# Patient Record
Sex: Female | Born: 1975
Health system: Southern US, Community
[De-identification: ages and names within clinical notes are randomized; demographics above are authoritative.]

## PROBLEM LIST (undated history)

## (undated) DIAGNOSIS — M1990 Unspecified osteoarthritis, unspecified site: Secondary | ICD-10-CM

## (undated) DIAGNOSIS — F32A Depression, unspecified: Secondary | ICD-10-CM

## (undated) DIAGNOSIS — N926 Irregular menstruation, unspecified: Secondary | ICD-10-CM

## (undated) DIAGNOSIS — F329 Major depressive disorder, single episode, unspecified: Secondary | ICD-10-CM

## (undated) DIAGNOSIS — N920 Excessive and frequent menstruation with regular cycle: Secondary | ICD-10-CM

## (undated) DIAGNOSIS — F419 Anxiety disorder, unspecified: Secondary | ICD-10-CM

## (undated) HISTORY — DX: Excessive and frequent menstruation with regular cycle: N92.0

## (undated) HISTORY — DX: Irregular menstruation, unspecified: N92.6

## (undated) HISTORY — PX: LAPAROSCOPY ABDOMEN DIAGNOSTIC: PRO50

## (undated) HISTORY — DX: Depression, unspecified: F32.A

## (undated) HISTORY — DX: Anxiety disorder, unspecified: F41.9

## (undated) HISTORY — DX: Unspecified osteoarthritis, unspecified site: M19.90

## (undated) HISTORY — DX: Major depressive disorder, single episode, unspecified: F32.9

## (undated) HISTORY — PX: TUBAL LIGATION: SHX77

---

## 2005-10-19 ENCOUNTER — Inpatient Hospital Stay: Payer: Self-pay | Admitting: Internal Medicine

## 2006-09-05 ENCOUNTER — Inpatient Hospital Stay: Payer: Self-pay | Admitting: Internal Medicine

## 2007-05-30 IMAGING — US ABDOMEN ULTRASOUND
1 series · 17 of 25 positions shown · non-contrast
Comparison: none

REASON FOR EXAM: Persistent nausea,  abdominal pain
COMMENTS:

[Series 1: abdomen ultrasound · 17 of 77 slices shown]
[im 1/77]
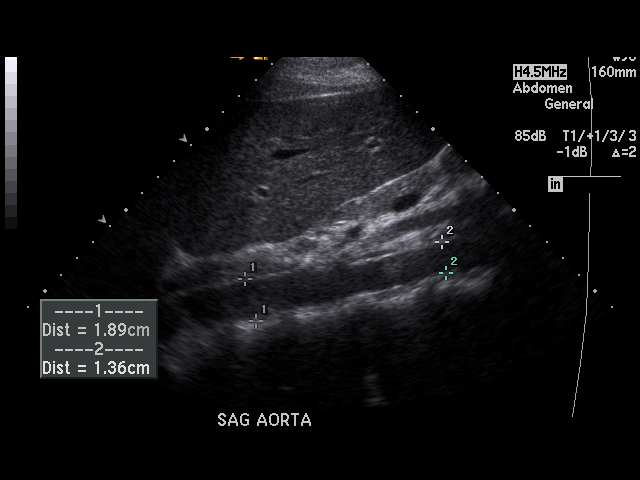
[im 7/77]
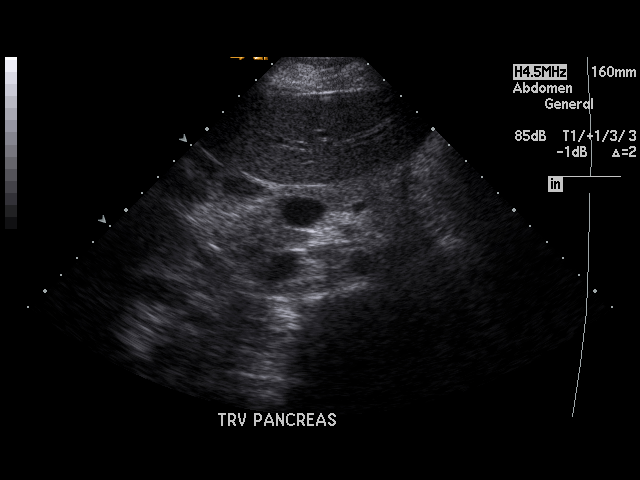
[im 10/77]
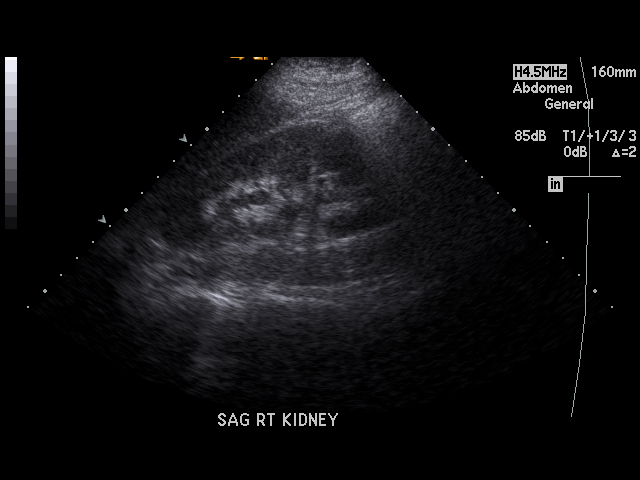
[im 16/77]
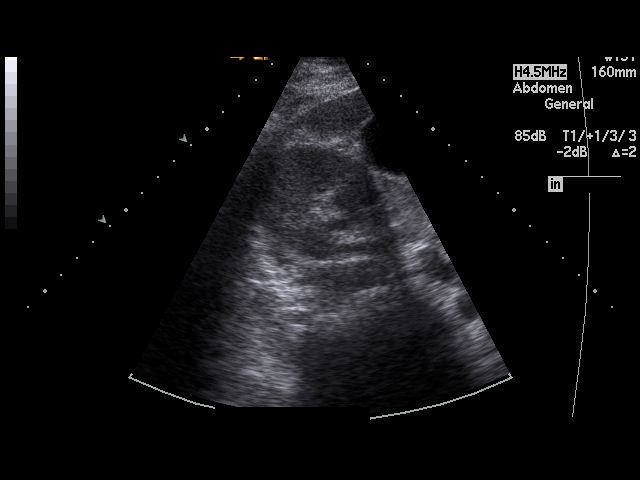
[im 20/77]
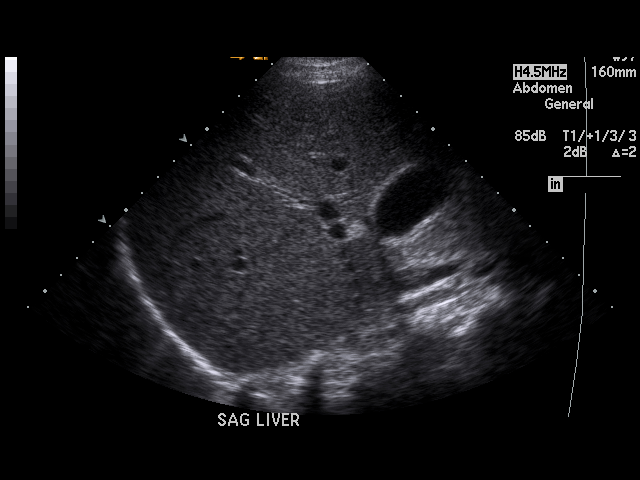
[im 26/77]
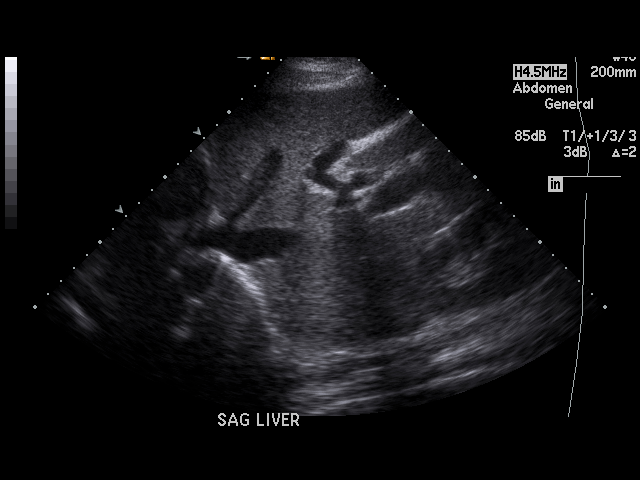
[im 29/77]
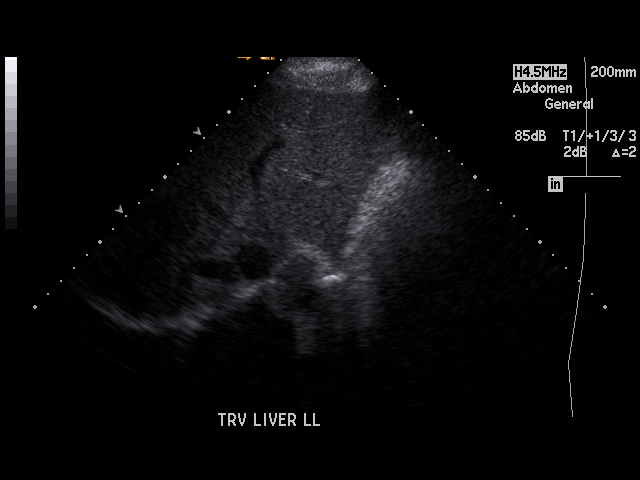
[im 35/77]
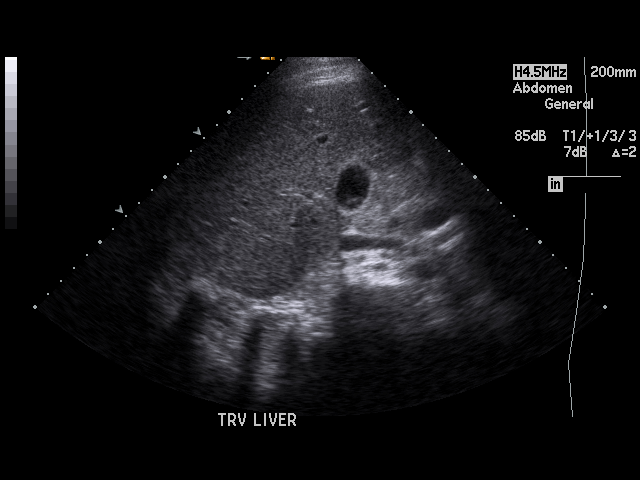
[im 39/77]
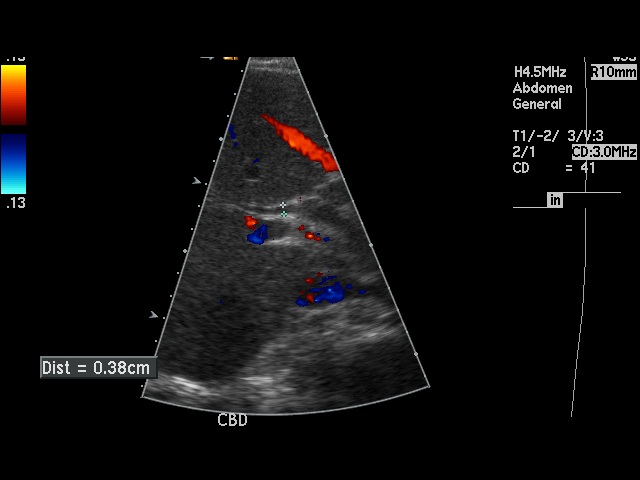
[im 42/77]
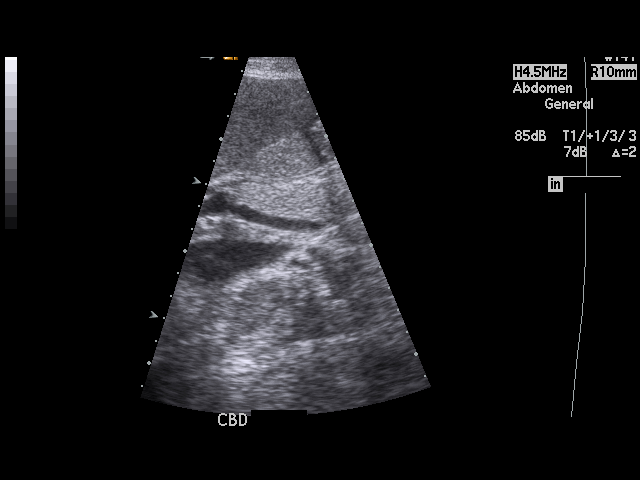
[im 48/77]
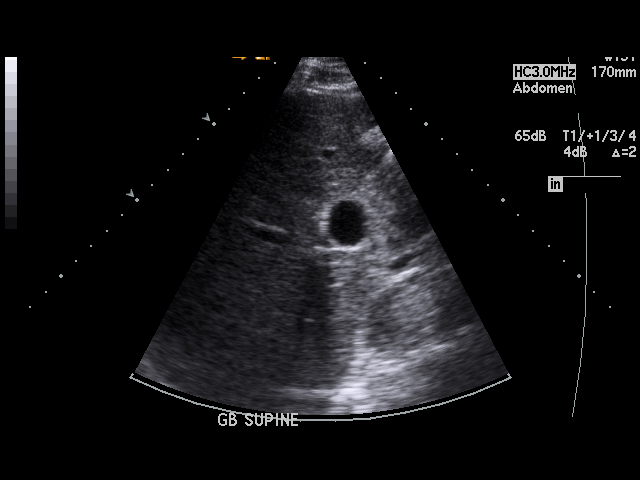
[im 51/77]
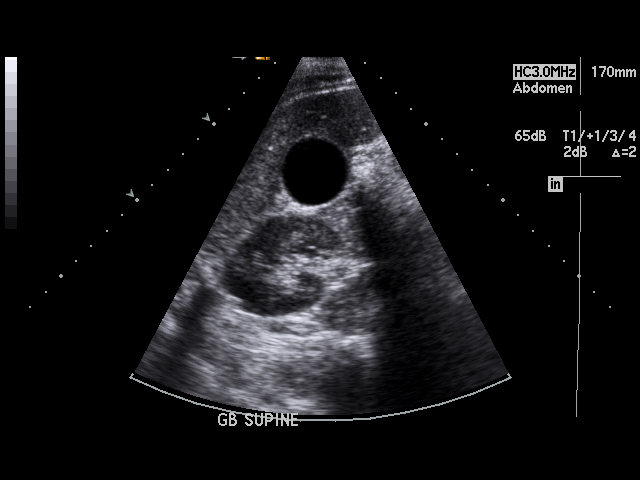
[im 58/77]
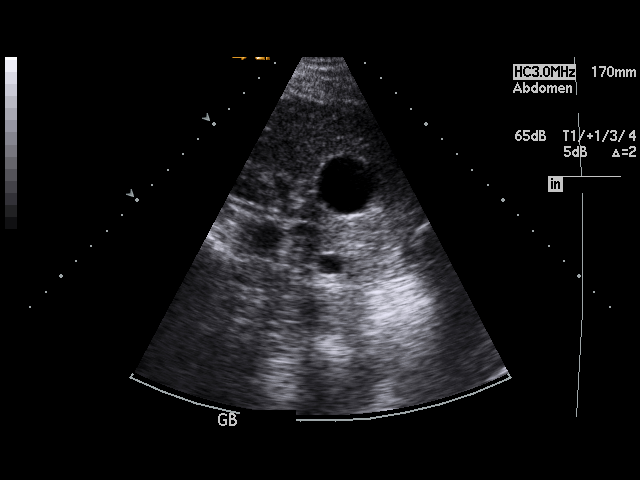
[im 61/77]
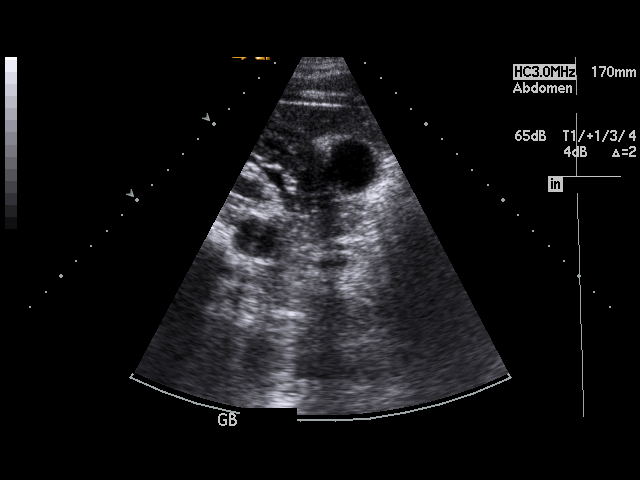
[im 67/77]
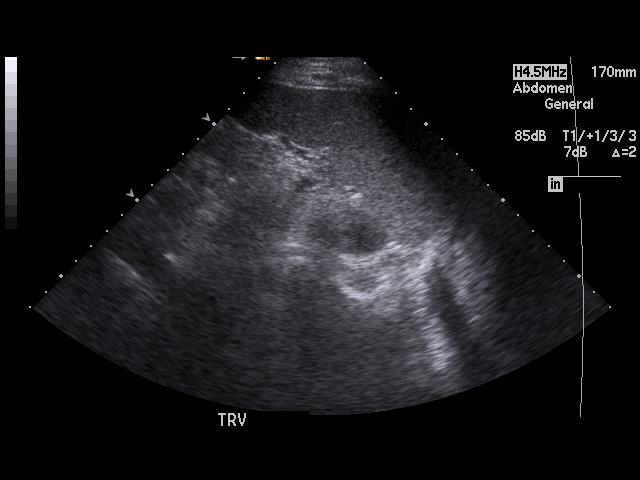
[im 70/77]
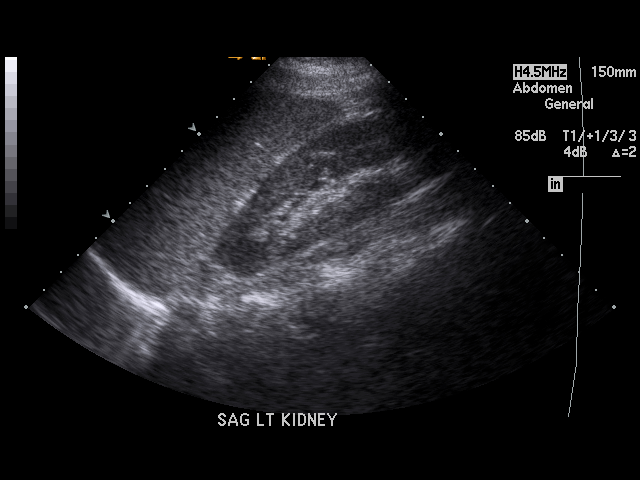
[im 77/77]
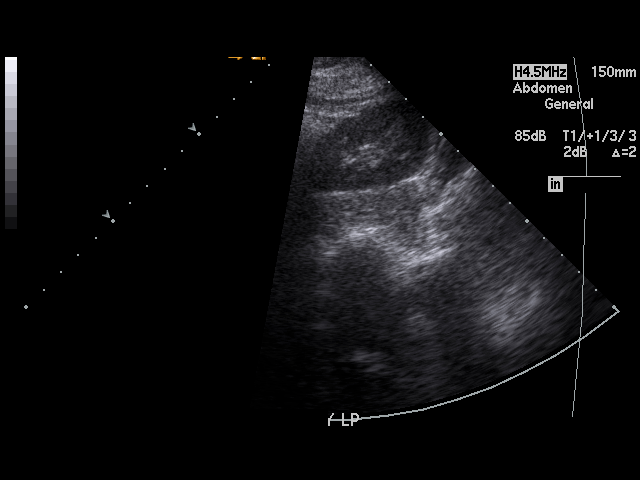

[17 of 25 positions shown; findings below may reference images not displayed]

PROCEDURE:     US  - US ABDOMEN GENERAL SURVEY  - October 25, 2005  [DATE]

RESULT:        The liver, spleen, pancreas and abdominal aorta are normal in
appearance.  No gallstones are seen.  There is no thickening of the
gallbladder wall.  The common bile duct measures 5.5 mm in diameter which is
within normal limits.  The kidneys show no hydronephrosis.  There is no
ascites.
IMPRESSION: No significant abnormalities are noted.

## 2008-01-27 ENCOUNTER — Ambulatory Visit: Payer: Self-pay | Admitting: Unknown Physician Specialty

## 2011-10-13 ENCOUNTER — Emergency Department: Payer: Self-pay | Admitting: Emergency Medicine

## 2011-10-13 LAB — CBC WITH DIFFERENTIAL/PLATELET
Basophil #: 0 10*3/uL (ref 0.0–0.1)
Basophil %: 0.3 %
Eosinophil #: 0.3 10*3/uL (ref 0.0–0.7)
HGB: 11.8 g/dL — ABNORMAL LOW (ref 12.0–16.0)
Lymphocyte #: 0.8 10*3/uL — ABNORMAL LOW (ref 1.0–3.6)
Lymphocyte %: 17 %
MCH: 29.5 pg (ref 26.0–34.0)
Monocyte #: 0.3 10*3/uL (ref 0.0–0.7)
Neutrophil %: 71.7 %
Platelet: 120 10*3/uL — ABNORMAL LOW (ref 150–440)
RDW: 13.3 % (ref 11.5–14.5)
WBC: 4.8 10*3/uL (ref 3.6–11.0)

## 2011-10-13 LAB — BASIC METABOLIC PANEL
Chloride: 107 mmol/L (ref 98–107)
Co2: 24 mmol/L (ref 21–32)
Creatinine: 0.73 mg/dL (ref 0.60–1.30)
Glucose: 87 mg/dL (ref 65–99)
Osmolality: 279 (ref 275–301)
Potassium: 3.7 mmol/L (ref 3.5–5.1)
Sodium: 141 mmol/L (ref 136–145)

## 2011-10-13 LAB — RAPID INFLUENZA A&B ANTIGENS

## 2011-11-06 ENCOUNTER — Ambulatory Visit: Payer: Self-pay | Admitting: Internal Medicine

## 2011-11-28 ENCOUNTER — Ambulatory Visit: Payer: Self-pay | Admitting: Internal Medicine

## 2012-01-02 ENCOUNTER — Encounter: Payer: Self-pay | Admitting: Internal Medicine

## 2012-01-02 ENCOUNTER — Ambulatory Visit (INDEPENDENT_AMBULATORY_CARE_PROVIDER_SITE_OTHER): Payer: BC Managed Care – PPO | Admitting: Internal Medicine

## 2012-01-02 VITALS — BP 142/82 | HR 94 | Temp 98.2°F | Ht 66.5 in | Wt 148.0 lb

## 2012-01-02 DIAGNOSIS — F419 Anxiety disorder, unspecified: Secondary | ICD-10-CM

## 2012-01-02 DIAGNOSIS — R030 Elevated blood-pressure reading, without diagnosis of hypertension: Secondary | ICD-10-CM

## 2012-01-02 DIAGNOSIS — IMO0001 Reserved for inherently not codable concepts without codable children: Secondary | ICD-10-CM

## 2012-01-02 DIAGNOSIS — F411 Generalized anxiety disorder: Secondary | ICD-10-CM

## 2012-01-02 DIAGNOSIS — Z0000 Encounter for general adult medical examination without abnormal findings: Secondary | ICD-10-CM

## 2012-01-02 NOTE — Assessment & Plan Note (Signed)
Blood pressure is elevated today. Patient does not have a history of hypertension. We'll plan to recheck in one month.
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
## 2012-01-02 NOTE — Assessment & Plan Note (Signed)
Offered support today. Patient refuses to consider medication or counseling. Will plan to see her back in one month for followup.

## 2012-01-03 LAB — CBC WITH DIFFERENTIAL/PLATELET
Basophils Relative: 0.8 % (ref 0.0–3.0)
Eosinophils Absolute: 0.1 10*3/uL (ref 0.0–0.7)
Eosinophils Relative: 2.7 % (ref 0.0–5.0)
Hemoglobin: 12.4 g/dL (ref 12.0–15.0)
MCHC: 33.9 g/dL (ref 30.0–36.0)
MCV: 88.1 fl (ref 78.0–100.0)
Monocytes Absolute: 0.6 10*3/uL (ref 0.1–1.0)
Neutro Abs: 3 10*3/uL (ref 1.4–7.7)
Neutrophils Relative %: 55.5 % (ref 43.0–77.0)
RBC: 4.17 Mil/uL (ref 3.87–5.11)
WBC: 5.5 10*3/uL (ref 4.5–10.5)

## 2012-01-03 LAB — COMPREHENSIVE METABOLIC PANEL
AST: 44 U/L — ABNORMAL HIGH (ref 0–37)
Albumin: 4.2 g/dL (ref 3.5–5.2)
Alkaline Phosphatase: 65 U/L (ref 39–117)
BUN: 15 mg/dL (ref 6–23)
Creatinine, Ser: 0.7 mg/dL (ref 0.4–1.2)
Glucose, Bld: 65 mg/dL — ABNORMAL LOW (ref 70–99)
Potassium: 3.8 mEq/L (ref 3.5–5.1)
Total Bilirubin: 0.1 mg/dL — ABNORMAL LOW (ref 0.3–1.2)

## 2012-01-03 LAB — LIPID PANEL
HDL: 34.8 mg/dL — ABNORMAL LOW (ref >39.00)
LDL Cholesterol: 82 mg/dL (ref 0–99)
Total CHOL/HDL Ratio: 4
Triglycerides: 73 mg/dL (ref 0.0–149.0)

## 2012-02-13 ENCOUNTER — Ambulatory Visit: Payer: BC Managed Care – PPO | Admitting: Internal Medicine

## 2012-03-19 ENCOUNTER — Encounter: Payer: Self-pay | Admitting: Internal Medicine

## 2012-03-19 ENCOUNTER — Ambulatory Visit (INDEPENDENT_AMBULATORY_CARE_PROVIDER_SITE_OTHER): Payer: BC Managed Care – PPO | Admitting: Internal Medicine

## 2012-03-19 VITALS — BP 110/76 | HR 80 | Temp 98.3°F | Ht 66.5 in | Wt 145.5 lb

## 2012-03-19 DIAGNOSIS — B373 Candidiasis of vulva and vagina: Secondary | ICD-10-CM

## 2012-03-19 DIAGNOSIS — J309 Allergic rhinitis, unspecified: Secondary | ICD-10-CM | POA: Insufficient documentation

## 2012-03-19 DIAGNOSIS — K759 Inflammatory liver disease, unspecified: Secondary | ICD-10-CM

## 2012-03-19 DIAGNOSIS — J0100 Acute maxillary sinusitis, unspecified: Secondary | ICD-10-CM

## 2012-03-19 LAB — COMPREHENSIVE METABOLIC PANEL
ALT: 56 U/L — ABNORMAL HIGH (ref 0–35)
AST: 44 U/L — ABNORMAL HIGH (ref 0–37)
Chloride: 105 mEq/L (ref 96–112)
Creatinine, Ser: 0.8 mg/dL (ref 0.4–1.2)
Sodium: 139 mEq/L (ref 135–145)
Total Bilirubin: 0.3 mg/dL (ref 0.3–1.2)
Total Protein: 7.8 g/dL (ref 6.0–8.3)

## 2012-03-19 MED ORDER — FLUCONAZOLE 150 MG PO TABS: 150.0000 mg | ORAL_TABLET | Freq: Once | ORAL | Status: AC

## 2012-03-19 MED ORDER — AMOXICILLIN-POT CLAVULANATE 875-125 MG PO TABS: 1.0000 | ORAL_TABLET | Freq: Two times a day (BID) | ORAL | Status: AC

## 2012-03-19 MED ORDER — FLUTICASONE PROPIONATE 50 MCG/ACT NA SUSP: 2.0000 | Freq: Every day | NASAL | Status: DC

## 2012-03-19 NOTE — Assessment & Plan Note (Signed)
Symptoms and exam are consistent with acute maxillary sinusitis. Will treat with Augmentin. Patient will continue to use Zyrtec or Allegra with Sudafed as needed. She will also use ibuprofen as needed. She will call if symptoms are not improving over next 72 hours.

## 2012-03-19 NOTE — Progress Notes (Signed)
Subjective:    Patient ID: Cheryl Durham, female    DOB: 1976/03/07, 36 y.o.   MRN: [id number]  HPI 36YO female presents for acute visit complaining of a ten-day history of sinus congestion, facial pain, purulent drainage from her eyes, fatigue. Symptoms first began 10 days ago with clear nasal congestion. She then developed more purulent nasal congestion, headache, and redness in her eyes with purulent drainage. She denies fever or chills. She denies cough. She has been taking over-the-counter Allegra, Zyrtec, and Mucinex with no improvement.  She is also concerned today about history of elevated liver function tests. These were noted on previous labs. There is mild elevation of ALT and AST. Patient reports that her husband was previously diagnosed with viral hepatitis. She is unsure which type. She notes he was treated with medication at a local hospital. She has never been checked for hepatitis.  Outpatient Encounter Prescriptions as of 03/19/2012  Medication Sig Dispense Refill  . cetirizine (ZYRTEC) 10 MG tablet Take 10 mg by mouth daily.      . diphenhydrAMINE (BENADRYL) 25 MG tablet Take 25 mg by mouth at bedtime as needed.      . fexofenadine (ALLEGRA) 180 MG tablet Take 180 mg by mouth daily.      . Melatonin 3 MG CAPS Take 2 capsules by mouth at bedtime as needed.      Marland Kitchen amoxicillin-clavulanate (AUGMENTIN) 875-125 MG per tablet Take 1 tablet by mouth 2 (two) times daily.  20 tablet  0  . fluconazole (DIFLUCAN) 150 MG tablet Take 1 tablet (150 mg total) by mouth once.  1 tablet  0  . fluticasone (FLONASE) 50 MCG/ACT nasal spray Place 2 sprays into the nose daily.  16 g  6    Review of Systems  Constitutional: Positive for fatigue. Negative for fever, chills and unexpected weight change.  HENT: Positive for congestion, facial swelling, rhinorrhea, postnasal drip and sinus pressure. Negative for hearing loss, ear pain, nosebleeds, sore throat, sneezing, mouth sores, trouble swallowing,  neck pain, neck stiffness, voice change, tinnitus and ear discharge.   Eyes: Positive for discharge and redness. Negative for pain and visual disturbance.  Respiratory: Negative for cough, chest tightness, shortness of breath, wheezing and stridor.   Cardiovascular: Negative for chest pain, palpitations and leg swelling.  Musculoskeletal: Negative for myalgias and arthralgias.  Skin: Negative for color change and rash.  Neurological: Positive for headaches. Negative for dizziness, weakness and light-headedness.  Hematological: Negative for adenopathy.   BP 110/76  Pulse 80  Temp 98.3 F (36.8 C) (Oral)  Ht 5' 6.5" (1.689 m)  Wt 145 lb 8 oz (65.998 kg)  BMI 23.13 kg/m2  SpO2 98%  LMP 03/17/2012     Objective:   Physical Exam  Constitutional: She is oriented to person, place, and time. She appears well-developed and well-nourished. No distress.  HENT:  Head: Normocephalic and atraumatic.  Right Ear: Tympanic membrane and external ear normal.  Left Ear: Tympanic membrane and external ear normal.  Nose: Mucosal edema and rhinorrhea present.  Mouth/Throat: Oropharynx is clear and moist. No oropharyngeal exudate.  Eyes: EOM are normal. Pupils are equal, round, and reactive to light. Right eye exhibits discharge. Left eye exhibits discharge. Right conjunctiva is injected. Left conjunctiva is injected. No scleral icterus.  Neck: Normal range of motion. Neck supple. No tracheal deviation present. No thyromegaly present.  Cardiovascular: Normal rate, regular rhythm, normal heart sounds and intact distal pulses.  Exam reveals no gallop and no friction rub.  No murmur heard. Pulmonary/Chest: Effort normal and breath sounds normal. No respiratory distress. She has no wheezes. She has no rales. She exhibits no tenderness.  Musculoskeletal: Normal range of motion. She exhibits no edema and no tenderness.  Lymphadenopathy:    She has no cervical adenopathy.  Neurological: She is alert and  oriented to person, place, and time. No cranial nerve deficit. She exhibits normal muscle tone. Coordination normal.  Skin: Skin is warm and dry. No rash noted. She is not diaphoretic. No erythema. No pallor.  Psychiatric: She has a normal mood and affect. Her behavior is normal. Judgment and thought content normal.          Assessment & Plan:

## 2012-03-19 NOTE — Assessment & Plan Note (Signed)
Patient was noted to have mild elevation of LFTs. However, she reports that her husband was diagnosed with viral hepatitis and description is most consistent with hepatitis C. Will send a screening for hepatitis B and hepatitis C today.

## 2012-03-20 LAB — HEPATITIS B SURFACE ANTIBODY,QUALITATIVE: Hep B S Ab: NEGATIVE

## 2012-03-20 LAB — HEPATITIS B SURFACE ANTIGEN: Hepatitis B Surface Ag: NEGATIVE

## 2012-08-06 ENCOUNTER — Ambulatory Visit (INDEPENDENT_AMBULATORY_CARE_PROVIDER_SITE_OTHER): Payer: BC Managed Care – PPO | Admitting: Internal Medicine

## 2012-08-06 ENCOUNTER — Encounter: Payer: Self-pay | Admitting: Internal Medicine

## 2012-08-06 VITALS — BP 126/78 | HR 66 | Temp 98.4°F | Resp 12 | Ht 66.5 in | Wt 154.2 lb

## 2012-08-06 DIAGNOSIS — J069 Acute upper respiratory infection, unspecified: Secondary | ICD-10-CM

## 2012-08-06 MED ORDER — PROMETHAZINE HCL 12.5 MG PO TABS: 12.5000 mg | ORAL_TABLET | Freq: Three times a day (TID) | ORAL | Status: DC | PRN

## 2012-08-06 MED ORDER — AMOXICILLIN-POT CLAVULANATE 875-125 MG PO TABS: 1.0000 | ORAL_TABLET | Freq: Two times a day (BID) | ORAL | Status: DC

## 2012-08-06 NOTE — Patient Instructions (Signed)
You have a viral  Syndrome .  The post nasal drip is causing your sore throat.  Flush your sinuses twice daily with Simply saline nasal spray.  Use benadryl 25 mg every 8 hours and Sudafed PE 10 to 30 every 8 hours to manage post nasal drip and  congestion.  Gargle with salt water as needed for the sore throat.  I am calling in phenergan for the nausea .  If you develop a temperature  > 100.4,  Greenor bloody nasal discharge,  Or facial pain,  You can start the antibiotic.

## 2012-08-06 NOTE — Progress Notes (Signed)
Patient ID: Cheryl Durham, female   DOB: Apr 16, 1976, 36 y.o.   MRN: [id number]  Patient Active Problem List  Diagnosis  . Anxiety  . Elevated BP  . Sinusitis, acute maxillary  . Allergic rhinitis  . Vaginal candidiasis  . Hepatitis  . Acute URI of multiple sites    Subjective:  CC:   Chief Complaint  Patient presents with  . Sinus Problem    HPI:   Cheryl Durham a 36 y.o. female who presents with Sore throat and ear ache started on Sunday night , left > right.,  Then sinuses congested  And she developed mild  nausea.  No fevers, myalgias or sick contacts.,  No purueltn nasal drainage or sputum. Taking OTC meds intermittently    Past Medical History  Diagnosis Date  . Endometriosis   . Heavy menstrual bleeding   . Abnormal menstrual cycle   . 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 Anxiety   . Depression     Past Surgical History  Procedure Date  . Tubal ligation   . Laparoscopy abdomen diagnostic          The following portions of the patient's history were reviewed and updated as appropriate: Allergies, current medications, and problem list.    Review of Systems:   12 Pt  review of systems was negative except those addressed in the HPI,     History   Social History  . Marital Status: Married    Spouse Name: N/A    Number of Children: 0  . Years of Education: N/A   Occupational History  . [employer]    Social History Main Topics  . Smoking status: Never Smoker   . Smokeless tobacco: Never Used  . Alcohol Use: Yes     Rarely  . Drug Use: No  . Sexually Active: Not on file   Other Topics Concern  . Not on file   Social History Narrative   Lives in [place]. Married, has 2 step children. Work - Education officer, environmental, Education officer, environmental. Dog in house.Regular Exercise -  Run 4 to 5 times a week typically 3-4 milesDaily Caffeine Use:  Mt Dew 2 liter daily    Objective:  BP 126/78  Pulse 66  Temp 98.4 F (36.9 C) (Oral)  Resp 12   Ht 5' 6.5" (1.689 m)  Wt 154 lb 4 oz (69.967 kg)  BMI 24.52 kg/m2  SpO2 97%  LMP 08/06/2012  General appearance: alert, cooperative and appears stated age Ears: normal TM's and external ear canals both ears Throat: lips, mucosa, and tongue normal; teeth and gums normal Neck: no adenopathy, no carotid bruit, supple, symmetrical, trachea midline and thyroid not enlarged, symmetric, no tenderness/mass/nodules Back: symmetric, no curvature. ROM normal. No CVA tenderness. Lungs: clear to auscultation bilaterally Heart: regular rate and rhythm, S1, S2 normal, no murmur, click, rub or gallop Abdomen: soft, non-tender; bowel sounds normal; no masses,  no organomegaly Pulses: 2+ and symmetric Skin: Skin color, texture, turgor normal. No rashes or lesions Lymph nodes: Cervical, supraclavicular, and axillary nodes normal.  Assessment and Plan:  Acute URI of multiple sites Rapid influenza was negative and no signs of strep pharyngitis or OM on exam. Symptoms of  URI are caused by viral infection currently given her current symptoms.   I have explained that in viral URIS, an antibiotic will not help the symptoms and will increase the risk of developing diarrhea. Advised to use oral and nasal decongestants,  Ibuprofen 400 mg and tylenol 650 mq 8 hrs for aches and pains,  tessalon every 8 hours prn cough  Advised to start round of abx only if symptoms worsen to include fevers, facial pain, purulent sputum./drainage.    Updated Medication List Outpatient Encounter Prescriptions as of 08/06/2012  Medication Sig Dispense Refill  . cetirizine (ZYRTEC) 10 MG tablet Take 10 mg by mouth daily.      . diphenhydrAMINE (BENADRYL) 25 MG tablet Take 25 mg by mouth at bedtime as needed.      . fluticasone (FLONASE) 50 MCG/ACT nasal spray Place 2 sprays into the nose daily.  16 g  6  . Melatonin 3 MG CAPS Take 2 capsules by mouth at bedtime as needed.      Marland Kitchen amoxicillin-clavulanate (AUGMENTIN) 875-125 MG per  tablet Take 1 tablet by mouth 2 (two) times daily.  14 tablet  0  . fexofenadine (ALLEGRA) 180 MG tablet Take 180 mg by mouth daily.      . promethazine (PHENERGAN) 12.5 MG tablet Take 1 tablet (12.5 mg total) by mouth every 8 (eight) hours as needed for nausea.  30 tablet  0     No orders of the defined types were placed in this encounter.    No Follow-up on file.

## 2012-08-08 ENCOUNTER — Encounter: Payer: Self-pay | Admitting: Internal Medicine

## 2012-08-08 DIAGNOSIS — J069 Acute upper respiratory infection, unspecified: Secondary | ICD-10-CM | POA: Insufficient documentation

## 2012-08-08 NOTE — Assessment & Plan Note (Signed)
Rapid influenza was negative and no signs of strep pharyngitis or OM on exam. Symptoms of  URI are caused by viral infection currently given her current symptoms.   I have explained that in viral URIS, an antibiotic will not help the symptoms and will increase the risk of developing diarrhea. Advised to use oral and nasal decongestants,  Ibuprofen 400 mg and tylenol 650 mq 8 hrs for aches and pains,  tessalon every 8 hours prn cough  Advised to start round of abx only if symptoms worsen to include fevers, facial pain, purulent sputum./drainage.

## 2012-08-11 ENCOUNTER — Other Ambulatory Visit: Payer: Self-pay | Admitting: Internal Medicine

## 2012-08-11 MED ORDER — FLUCONAZOLE 150 MG PO TABS: 150.0000 mg | ORAL_TABLET | Freq: Once | ORAL | Status: DC

## 2012-08-11 NOTE — Telephone Encounter (Signed)
Pt would like something called in for yeast infection [facility]

## 2012-08-11 NOTE — Telephone Encounter (Signed)
Please call in Diflucan 150mg  po x1

## 2012-08-11 NOTE — Telephone Encounter (Signed)
Rx sent to [facility], patient advised via message left on cell phone voicemail.

## 2013-08-24 ENCOUNTER — Telehealth: Payer: Self-pay | Admitting: Internal Medicine

## 2013-08-24 NOTE — Telephone Encounter (Signed)
Was in several months ago and she had several labs done, would like to know if her thyroid was tested. No thyroid testing was done, she has not been seen since last year 12/2011 by you, did have an acute visit with Dr. Darrick Huntsman 03/2012. She would like to come in to have some labs drawn then be seen for an appointment.

## 2013-08-24 NOTE — Telephone Encounter (Signed)
Pt asking what all tests were run on her at her previous visit.  Asking if she had anything tested for thyroid.  If not, pt would like to come in for blood work.  Please contact pt.

## 2013-08-24 NOTE — Telephone Encounter (Signed)
Her thyroid function was not checked. I would recommend that we set up a visit first, that way we can determine if any additional labs need to be ordered.

## 2013-08-25 NOTE — Telephone Encounter (Signed)
Left message for patient to call office back to schedule an appointment 

## 2013-11-10 ENCOUNTER — Telehealth: Payer: Self-pay | Admitting: Internal Medicine

## 2013-11-10 ENCOUNTER — Emergency Department: Payer: Self-pay | Admitting: Emergency Medicine

## 2013-11-10 LAB — BASIC METABOLIC PANEL
Anion Gap: 4 — ABNORMAL LOW (ref 7–16)
BUN: 6 mg/dL — ABNORMAL LOW (ref 7–18)
CALCIUM: 8.5 mg/dL (ref 8.5–10.1)
CO2: 29 mmol/L (ref 21–32)
Chloride: 104 mmol/L (ref 98–107)
Creatinine: 0.66 mg/dL (ref 0.60–1.30)
EGFR (African American): >60
EGFR (Non-African Amer.): >60
GLUCOSE: 91 mg/dL (ref 65–99)
Osmolality: 271 (ref 275–301)
POTASSIUM: 3.8 mmol/L (ref 3.5–5.1)
SODIUM: 137 mmol/L (ref 136–145)

## 2013-11-10 LAB — CBC
HCT: 39.9 % (ref 35.0–47.0)
HGB: 13.4 g/dL (ref 12.0–16.0)
MCH: 29.8 pg (ref 26.0–34.0)
MCHC: 33.7 g/dL (ref 32.0–36.0)
MCV: 89 fL (ref 80–100)
Platelet: 168 10*3/uL (ref 150–440)
RBC: 4.51 10*6/uL (ref 3.80–5.20)
RDW: 13.5 % (ref 11.5–14.5)
WBC: 4.5 10*3/uL (ref 3.6–11.0)

## 2013-11-10 LAB — URINALYSIS, COMPLETE
Bilirubin,UR: NEGATIVE
Blood: NEGATIVE
Glucose,UR: NEGATIVE mg/dL (ref 0–75)
Ketone: NEGATIVE
Leukocyte Esterase: NEGATIVE
NITRITE: NEGATIVE
PROTEIN: NEGATIVE
Ph: 6 (ref 4.5–8.0)
RBC,UR: <1 /HPF (ref 0–5)
SPECIFIC GRAVITY: 1.005 (ref 1.003–1.030)
Squamous Epithelial: 1
WBC UR: 2 /HPF (ref 0–5)

## 2013-11-10 NOTE — Telephone Encounter (Signed)
Patient Information:  Caller Name: Cheryl Durham  Phone: [phone]  Patient: Cheryl Durham, Cheryl Durham  Gender: Female  DOB: 01/02/1976  Age: 38 Years  PCP: Duncan Dullullo, Teresa (Adults only)  Pregnant: No  Office Follow Up:  Does the office need to follow up with this patient?: Yes  Instructions For The Office: Please review  RN Note:  Per standing orders.  Message sent in Epic to try to work in pt. Call back information given  Symptoms  Reason For Call & Symptoms: Pt called for appt and was not able to schedule today.  Onset 2/1.  Pt states that it started after crying.  Eyes are remained puffy.  Pain above eyes.  Vision is blurry.  Pt states that she can not read the computer or book.  Eyes hurt.  No other sxs Afebrile.  No headache  Left eye is worse than the right.  Photophobia is present.  Reviewed Health History In EMR: Yes  Reviewed Medications In EMR: Yes  Reviewed Allergies In EMR: Yes  Reviewed Surgeries / Procedures: Yes  Date of Onset of Symptoms: 11/08/2013 OB / GYN:  LMP: Unknown  Guideline(s) Used:  Eye Pain  Disposition Per Guideline:   Go to Office Now  Reason For Disposition Reached:   Looking at light causes severe pain (i.e., photophobia)  Advice Given:  Call Back If:  You become worse.  RN Overrode Recommendation:  Document Patient  No appt, please review and advise

## 2013-11-10 NOTE — Telephone Encounter (Signed)
Left message to call back  

## 2013-11-11 NOTE — Telephone Encounter (Signed)
Reviewed patient chart just now, appears she called and schedule an appointment to see Dr. Dan HumphreysWalker on Friday.

## 2013-11-13 ENCOUNTER — Ambulatory Visit (INDEPENDENT_AMBULATORY_CARE_PROVIDER_SITE_OTHER): Payer: BC Managed Care – PPO | Admitting: Internal Medicine

## 2013-11-13 ENCOUNTER — Encounter: Payer: Self-pay | Admitting: Internal Medicine

## 2013-11-13 ENCOUNTER — Encounter (INDEPENDENT_AMBULATORY_CARE_PROVIDER_SITE_OTHER): Payer: Self-pay

## 2013-11-13 VITALS — BP 122/86 | HR 72 | Temp 98.0°F | Wt 156.0 lb

## 2013-11-13 DIAGNOSIS — R7989 Other specified abnormal findings of blood chemistry: Secondary | ICD-10-CM

## 2013-11-13 DIAGNOSIS — F419 Anxiety disorder, unspecified: Secondary | ICD-10-CM

## 2013-11-13 DIAGNOSIS — R945 Abnormal results of liver function studies: Secondary | ICD-10-CM

## 2013-11-13 DIAGNOSIS — G4700 Insomnia, unspecified: Secondary | ICD-10-CM

## 2013-11-13 DIAGNOSIS — I10 Essential (primary) hypertension: Secondary | ICD-10-CM

## 2013-11-13 DIAGNOSIS — F411 Generalized anxiety disorder: Secondary | ICD-10-CM

## 2013-11-13 MED ORDER — TRAZODONE HCL 50 MG PO TABS: 25.0000 mg | ORAL_TABLET | Freq: Every evening | ORAL | Status: DC | PRN

## 2013-11-13 NOTE — Assessment & Plan Note (Signed)
Chronic anxiety, recently worsened. Likely exacerbated by poor sleep. As described, will start Trazodone. If no improvement in symptoms, consider starting SSRI and referral to psychiatry. Follow up 4 weeks and prn.
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
## 2013-11-13 NOTE — Progress Notes (Signed)
Pre-visit discussion using our clinic review tool. No additional management support is needed unless otherwise documented below in the visit note.  

## 2013-11-13 NOTE — Assessment & Plan Note (Signed)
Chronic insomnia. Encouraged her to stop Benadryl 75mg , given excessive dose. Encouraged her to stop wine at night. Will start Trazodone 25-50mg  po qhs. She will email or call with follow up. Recheck in 4 weeks.

## 2013-11-13 NOTE — Assessment & Plan Note (Signed)
BP much improved today compared with recent ED visit. Will continue Lisinopril. Discussed potential side effects of this medication. Will recheck Cr and K with labs today. Follow up 4 weeks.

## 2013-11-13 NOTE — Patient Instructions (Signed)
Continue Lisinopril to help control blood pressure. Monitor blood pressure at home 1-2 times per week. Email with readings.  We will check kidney function and electrolytes with labs today.  Start Trazodone to help with insomnia. Email with update next week.

## 2013-11-16 ENCOUNTER — Telehealth: Payer: Self-pay | Admitting: Internal Medicine

## 2013-11-16 LAB — COMPREHENSIVE METABOLIC PANEL
ALT: 131 U/L — AB (ref 0–35)
AST: 91 U/L — AB (ref 0–37)
Albumin: 3.9 g/dL (ref 3.5–5.2)
Alkaline Phosphatase: 65 U/L (ref 39–117)
BUN: 11 mg/dL (ref 6–23)
CALCIUM: 8.8 mg/dL (ref 8.4–10.5)
CHLORIDE: 107 meq/L (ref 96–112)
CO2: 23 meq/L (ref 19–32)
Creatinine, Ser: 0.8 mg/dL (ref 0.4–1.2)
GFR: 83.66 mL/min (ref >60.00)
Glucose, Bld: 102 mg/dL — ABNORMAL HIGH (ref 70–99)
Potassium: 4.5 mEq/L (ref 3.5–5.1)
SODIUM: 138 meq/L (ref 135–145)
TOTAL PROTEIN: 7.9 g/dL (ref 6.0–8.3)
Total Bilirubin: 0.2 mg/dL — ABNORMAL LOW (ref 0.3–1.2)

## 2013-11-16 LAB — MICROALBUMIN / CREATININE URINE RATIO
CREATININE, U: 124.9 mg/dL
MICROALB/CREAT RATIO: 0.4 mg/g (ref 0.0–30.0)
Microalb, Ur: 0.5 mg/dL (ref 0.0–1.9)

## 2013-11-16 LAB — LDL CHOLESTEROL, DIRECT: LDL DIRECT: 153.5 mg/dL

## 2013-11-16 LAB — LIPID PANEL
Cholesterol: 204 mg/dL — ABNORMAL HIGH (ref 0–200)
HDL: 48 mg/dL (ref >39.00)
Total CHOL/HDL Ratio: 4
Triglycerides: 72 mg/dL (ref 0.0–149.0)
VLDL: 14.4 mg/dL (ref 0.0–40.0)

## 2013-11-16 LAB — TSH: TSH: 10.39 u[IU]/mL — ABNORMAL HIGH (ref 0.35–5.50)

## 2013-11-16 NOTE — Telephone Encounter (Signed)
Relevant patient education assigned to patient using Emmi. ° °

## 2013-11-16 NOTE — Addendum Note (Signed)
Addended by: Ronna PolioWALKER, JENNIFER A on: 11/16/2013 07:46 PM   Modules accepted: Orders

## 2013-11-19 ENCOUNTER — Encounter: Payer: Self-pay | Admitting: *Deleted

## 2013-12-10 ENCOUNTER — Telehealth: Payer: Self-pay | Admitting: Internal Medicine

## 2013-12-10 MED ORDER — LEVOTHYROXINE SODIUM 50 MCG PO TABS: 50.0000 ug | ORAL_TABLET | Freq: Every day | ORAL | Status: DC

## 2013-12-10 NOTE — Telephone Encounter (Signed)
Rx sent to pharmacy by escript. Left message, notifying pt Rx sent to pharmacy.

## 2013-12-10 NOTE — Telephone Encounter (Signed)
Pt called regarding letter received about her thyroid.  States she will take the levothyroxine if Dr. Dan HumphreysWalker prescribes it.  States she took the medication a long time ago but stopped taking it because she did not feel any different whether she took it or not.  States she will start taking it again, [facility] [place].  Please call pt to let her know if this is sent, may leave msg.  Advised pt we need to set up appt for blood work, per note in lab results.  Pt has appt 3/16 with Dr. Dan HumphreysWalker for BP and would like to get the blood work done at the same time as her appt to see Dr. Dan HumphreysWalker.

## 2013-12-21 ENCOUNTER — Ambulatory Visit: Payer: BC Managed Care – PPO | Admitting: Internal Medicine

## 2013-12-23 ENCOUNTER — Ambulatory Visit (INDEPENDENT_AMBULATORY_CARE_PROVIDER_SITE_OTHER): Payer: BC Managed Care – PPO | Admitting: Internal Medicine

## 2013-12-23 ENCOUNTER — Encounter: Payer: Self-pay | Admitting: Internal Medicine

## 2013-12-23 VITALS — BP 106/72 | HR 80 | Temp 97.8°F | Wt 152.0 lb

## 2013-12-23 DIAGNOSIS — J309 Allergic rhinitis, unspecified: Secondary | ICD-10-CM

## 2013-12-23 DIAGNOSIS — E039 Hypothyroidism, unspecified: Secondary | ICD-10-CM | POA: Insufficient documentation

## 2013-12-23 DIAGNOSIS — N951 Menopausal and female climacteric states: Secondary | ICD-10-CM

## 2013-12-23 DIAGNOSIS — G4700 Insomnia, unspecified: Secondary | ICD-10-CM

## 2013-12-23 DIAGNOSIS — I10 Essential (primary) hypertension: Secondary | ICD-10-CM

## 2013-12-23 DIAGNOSIS — R232 Flushing: Secondary | ICD-10-CM

## 2013-12-23 LAB — LUTEINIZING HORMONE: LH: 17.18 m[IU]/mL

## 2013-12-23 LAB — FOLLICLE STIMULATING HORMONE: FSH: 7.6 m[IU]/mL

## 2013-12-23 MED ORDER — FLUTICASONE PROPIONATE 50 MCG/ACT NA SUSP: 2.0000 | Freq: Every day | NASAL | Status: AC

## 2013-12-23 MED ORDER — TRAZODONE HCL 150 MG PO TABS: 150.0000 mg | ORAL_TABLET | Freq: Every evening | ORAL | Status: DC | PRN

## 2013-12-23 NOTE — Progress Notes (Signed)
Pre visit review using our clinic review tool, if applicable. No additional management support is needed unless otherwise documented below in the visit note. 

## 2013-12-23 NOTE — Assessment & Plan Note (Signed)
Reviewed correct way to take thyroid medication. Plan repeat TSH in 4-6 weeks.

## 2013-12-23 NOTE — Assessment & Plan Note (Signed)
Recent hot flashes concerned pt for menopause, however LH and FSH not in range of menopause today. Likely temp intolerance related to hypothyroidism. Plan to repeat TSH in 6 weeks, as she recently started thyroid medication.

## 2013-12-23 NOTE — Progress Notes (Signed)
Subjective:    Patient ID: Cheryl Durham, female    DOB: 07/06/1976, 38 y.o.   MRN: [id number]  HPI 38YO female with HTN presents for follow up.  She did not consistently take Lisinopril after last visit. BP has been <120/80 at home. She has been following healthy diet and exercising. No chest pain, headache, palpitations.  She is concerned about recent hot flashes. Most prominent at night. She is concerned this may be menopause.  She also notes no improvement in insomnia with Trazodone. Would like to consider alternative medication. Taking Benadryl and Melatonin with Trazodone.   Review of Systems  Constitutional: Positive for diaphoresis. Negative for fever, chills, appetite change, fatigue and unexpected weight change.  HENT: Positive for postnasal drip, rhinorrhea and sneezing. Negative for congestion, ear pain, sinus pressure, sore throat, trouble swallowing and voice change.   Eyes: Negative for visual disturbance.  Respiratory: Negative for cough, shortness of breath, wheezing and stridor.   Cardiovascular: Negative for chest pain, palpitations and leg swelling.  Gastrointestinal: Negative for nausea, vomiting, abdominal pain, diarrhea, constipation, blood in stool, abdominal distention and anal bleeding.  Endocrine: Positive for heat intolerance.  Genitourinary: Negative for dysuria and flank pain.  Musculoskeletal: Negative for arthralgias, gait problem, myalgias and neck pain.  Skin: Negative for color change and rash.  Neurological: Negative for dizziness and headaches.  Hematological: Negative for adenopathy. Does not bruise/bleed easily.  Psychiatric/Behavioral: Positive for sleep disturbance. Negative for suicidal ideas and dysphoric mood. The patient is not nervous/anxious.        Objective:    BP 106/72  Pulse 80  Temp(Src) 97.8 F (36.6 C) (Oral)  Wt 152 lb (68.947 kg)  SpO2 99%  LMP 11/25/2013 Physical Exam  Constitutional: She is oriented to person,  place, and time. She appears well-developed and well-nourished. No distress.  HENT:  Head: Normocephalic and atraumatic.  Right Ear: External ear normal.  Left Ear: External ear normal.  Nose: Nose normal.  Mouth/Throat: Oropharynx is clear and moist. No oropharyngeal exudate.  Eyes: Conjunctivae are normal. Pupils are equal, round, and reactive to light. Right eye exhibits no discharge. Left eye exhibits no discharge. No scleral icterus.  Neck: Normal range of motion. Neck supple. No tracheal deviation present. No thyromegaly present.  Cardiovascular: Normal rate, regular rhythm, normal heart sounds and intact distal pulses.  Exam reveals no gallop and no friction rub.   No murmur heard. Pulmonary/Chest: Effort normal and breath sounds normal. No accessory muscle usage. Not tachypneic. No respiratory distress. She has no decreased breath sounds. She has no wheezes. She has no rhonchi. She has no rales. She exhibits no tenderness.  Musculoskeletal: Normal range of motion. She exhibits no edema and no tenderness.  Lymphadenopathy:    She has no cervical adenopathy.  Neurological: She is alert and oriented to person, place, and time. No cranial nerve deficit. She exhibits normal muscle tone. Coordination normal.  Skin: Skin is warm and dry. No rash noted. She is not diaphoretic. No erythema. No pallor.  Psychiatric: She has a normal mood and affect. Her behavior is normal. Judgment and thought content normal.          Assessment & Plan:   Problem List Items Addressed This Visit   Allergic rhinitis   Relevant Medications      fluticasone (FLONASE) 50 MCG/ACT nasal spray   Essential hypertension, benign - Primary     BP improved without pt taking medication. Will hold lisinopril for now. Continue to monitor BP.  Encouraged healthy diet and regular exercise, as she is doing.    Hot flashes     Recent hot flashes concerned pt for menopause, however LH and FSH not in range of menopause  today. Likely temp intolerance related to hypothyroidism. Plan to repeat TSH in 6 weeks, as she recently started thyroid medication.    Relevant Orders      FSH (Completed)      LH (Completed)   Insomnia     Symptoms poorly controlled on Trazodone 100mg . Will try increase in dose to 150mg  daily. Follow up 4 weeks and prn.    Relevant Medications      traZODone (DESYREL) tablet   Unspecified hypothyroidism     Reviewed correct way to take thyroid medication. Plan repeat TSH in 4-6 weeks.        Return in about 4 weeks (around 01/20/2014) for Recheck Thyroid.

## 2013-12-23 NOTE — Assessment & Plan Note (Signed)
BP improved without pt taking medication. Will hold lisinopril for now. Continue to monitor BP. Encouraged healthy diet and regular exercise, as she is doing.

## 2013-12-23 NOTE — Assessment & Plan Note (Signed)
Symptoms poorly controlled on Trazodone 100mg . Will try increase in dose to 150mg  daily. Follow up 4 weeks and prn.

## 2013-12-24 ENCOUNTER — Ambulatory Visit: Payer: BC Managed Care – PPO | Admitting: Internal Medicine

## 2014-02-03 ENCOUNTER — Ambulatory Visit: Payer: BC Managed Care – PPO | Admitting: Internal Medicine

## 2014-02-15 ENCOUNTER — Ambulatory Visit: Payer: BC Managed Care – PPO | Admitting: Internal Medicine

## 2014-05-09 ENCOUNTER — Other Ambulatory Visit: Payer: Self-pay | Admitting: Internal Medicine

## 2014-05-12 NOTE — Telephone Encounter (Signed)
Called and left message, notifying need for non fasting lab appointment

## 2014-05-13 NOTE — Telephone Encounter (Signed)
Pharmacy sent third request, no response from patient.

## 2014-05-20 ENCOUNTER — Telehealth: Payer: Self-pay | Admitting: *Deleted

## 2014-05-20 NOTE — Telephone Encounter (Signed)
She had thyroid dysfunction and marked elevation of liver function tests on last check. She must have LFTs and TSH prior to refills. We can give her a sheet for [facility] as they have some sites open later if needed.

## 2014-05-20 NOTE — Telephone Encounter (Signed)
Pt left me a message requesting a refill of Levothyroxine, She was given 10 tablets on 05/13/14 with a note stating NEEDS LABS ASAP, PLEASE CALL OFFICE TO SCHEDULE.  Pt stated on the vm that her office has been under staffed, therefore it is hard for her to get off of work, she is hoping that in a month they will have more people in the office so she can take time to come here.  Okay to refill? No labs since March

## 2014-05-24 NOTE — Telephone Encounter (Signed)
Cheryl Durham talked with pt on Friday, advised pt of notes, pt states that she will just come to the office when she can.

## 2014-05-27 ENCOUNTER — Other Ambulatory Visit (INDEPENDENT_AMBULATORY_CARE_PROVIDER_SITE_OTHER): Payer: BC Managed Care – PPO

## 2014-05-27 ENCOUNTER — Other Ambulatory Visit: Payer: Self-pay | Admitting: Internal Medicine

## 2014-05-27 DIAGNOSIS — E039 Hypothyroidism, unspecified: Secondary | ICD-10-CM

## 2014-05-27 DIAGNOSIS — R7402 Elevation of levels of lactic acid dehydrogenase (LDH): Secondary | ICD-10-CM

## 2014-05-27 DIAGNOSIS — R945 Abnormal results of liver function studies: Principal | ICD-10-CM

## 2014-05-27 DIAGNOSIS — R7989 Other specified abnormal findings of blood chemistry: Secondary | ICD-10-CM

## 2014-05-27 DIAGNOSIS — R740 Nonspecific elevation of levels of transaminase and lactic acid dehydrogenase [LDH]: Secondary | ICD-10-CM

## 2014-05-27 LAB — COMPREHENSIVE METABOLIC PANEL
ALT: 98 U/L — ABNORMAL HIGH (ref 0–35)
AST: 72 U/L — ABNORMAL HIGH (ref 0–37)
Albumin: 3.8 g/dL (ref 3.5–5.2)
Alkaline Phosphatase: 72 U/L (ref 39–117)
BUN: 13 mg/dL (ref 6–23)
CALCIUM: 8.8 mg/dL (ref 8.4–10.5)
CHLORIDE: 105 meq/L (ref 96–112)
CO2: 27 mEq/L (ref 19–32)
CREATININE: 0.8 mg/dL (ref 0.4–1.2)
GFR: 89.8 mL/min (ref >60.00)
Glucose, Bld: 96 mg/dL (ref 70–99)
Potassium: 4.5 mEq/L (ref 3.5–5.1)
Sodium: 137 mEq/L (ref 135–145)
Total Bilirubin: 0.4 mg/dL (ref 0.2–1.2)
Total Protein: 7.4 g/dL (ref 6.0–8.3)

## 2014-05-27 LAB — TSH: TSH: 5.97 u[IU]/mL — ABNORMAL HIGH (ref 0.35–4.50)

## 2014-05-27 MED ORDER — LEVOTHYROXINE SODIUM 88 MCG PO TABS: 88.0000 ug | ORAL_TABLET | Freq: Every day | ORAL | Status: AC

## 2014-05-27 NOTE — Telephone Encounter (Signed)
Pt states that her insurance wont pay for all these labs and that she needs her thyroid medication?

## 2014-05-27 NOTE — Telephone Encounter (Signed)
As discussed, we can order just TSH and CMP

## 2014-05-28 ENCOUNTER — Encounter: Payer: Self-pay | Admitting: *Deleted

## 2014-10-20 ENCOUNTER — Telehealth: Payer: Self-pay | Admitting: Internal Medicine

## 2014-10-20 NOTE — Telephone Encounter (Signed)
Patient Name: Cheryl Durham  DOB: 01/15/1976    Nurse Assessment  Nurse: Roderic OvensNorth, RN, Amy Date/Time Lamount Cohen(Eastern Time): 10/20/2014 1:21:46 PM  Confirm and document reason for call. If symptomatic, describe symptoms. ---CALLER STATES THAT SHE HAD SOME PAINFUL URINATION THIS MORNING. SHE STATES THAT SHE THOUGHT SHE FELT SOMETHING LAST NIGHT. SHE STATES THAT SHE IS NOT HAVING ANY SYMPTOMS NOW. SHE IS WANDERING IF SHE WILL BE BETTER IN THE MORNING. NO FEVER.  Has the patient traveled out of the country within the last 30 days? ---Not Applicable  Does the patient require triage? ---Yes  Related visit to physician within the last 2 weeks? ---No  Does the PT have any chronic conditions? (i.e. diabetes, asthma, etc.) ---Yes  List chronic conditions. ---THYROID  Did the patient indicate they were pregnant? ---No     Guidelines    Guideline Title Affirmed Question Affirmed Notes  Information Only Call Health Information question, no triage required and triager able to answer question    Final Disposition User   Home Care: Information or Advice Only Call [facility], RN, Amy    Comments  CALLER STATES THAT HER SYMPTOMS HAVE NOW RESOLVED. GAVE HER INFORMATION ON DRINKING HER FLUIDS TODAY AND WHAT TO WATCH FOR SUCH AS FEVER, PAIN, BURNING, FREQ WITH URINATION OR ANY ABD PAIN. SHE WILL CALL US BACK TO REPORT. SHE IS GOING TO INCR FLUIDS AND WILL CALLBACK IF SHE HAS ANY SYMPTOMS. SHE FEELS LIKE SHE IS OKAY NOW AND DOES NOT NEED AN APPT AT THIS TIME. WILL CALL BACK IF ANY FURTHER NEEDS.

## 2014-11-12 ENCOUNTER — Telehealth: Payer: Self-pay

## 2014-11-12 DIAGNOSIS — E038 Other specified hypothyroidism: Secondary | ICD-10-CM

## 2014-11-12 DIAGNOSIS — R945 Abnormal results of liver function studies: Secondary | ICD-10-CM

## 2014-11-12 DIAGNOSIS — R7989 Other specified abnormal findings of blood chemistry: Secondary | ICD-10-CM

## 2014-11-12 NOTE — Telephone Encounter (Signed)
Please have her come for a lab visit for TSH, Free T4, CMP. For hypothyroidism and elevated liver function test.

## 2014-11-12 NOTE — Telephone Encounter (Signed)
Please see last lab message, what additional liver labs were your wanting, or do you want to see her in office visit first?

## 2014-11-12 NOTE — Telephone Encounter (Signed)
Labs placed. Can you please call and schedule her for non fasting lab appt, thanks!

## 2014-11-12 NOTE — Telephone Encounter (Signed)
The patient is under the impression she is due for lab work regarding her thyroid.  Does she need to come in for this lab test?

## 2015-06-15 IMAGING — CT CT HEAD WITHOUT CONTRAST
1 series · 16 of 30 positions shown, 20 images · non-contrast
Comparison: None.

CLINICAL DATA: Severe headache, blurred vision.

EXAM:
CT HEAD WITHOUT CONTRAST
TECHNIQUE: Contiguous axial images were obtained from the base of the skull
through the vertex without intravenous contrast.

[Series 2: head wo · axial · 0.43mm/px · z∈[-128,+7]mm · 16 of 34 slices shown, 20 images]
[im 2/34  brain]
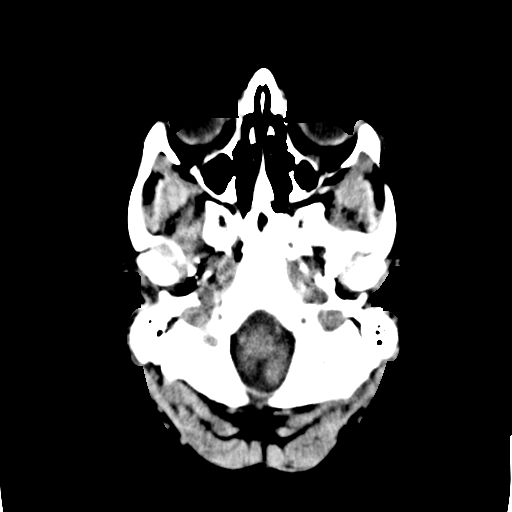
[im 2/34  bone]
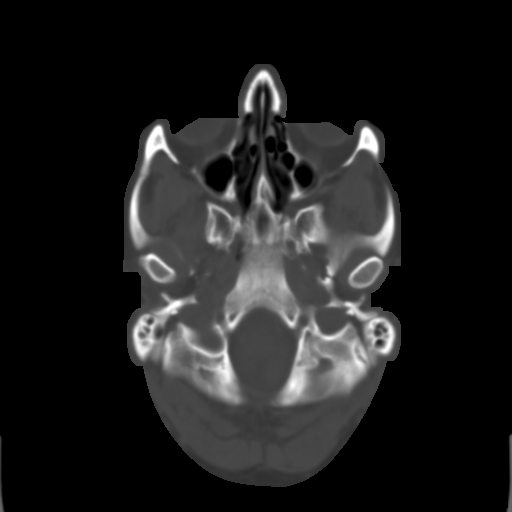
[im 4/34  brain]
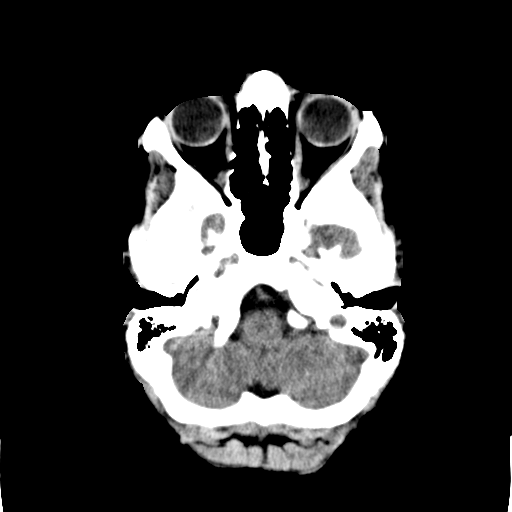
[im 6/34  brain]
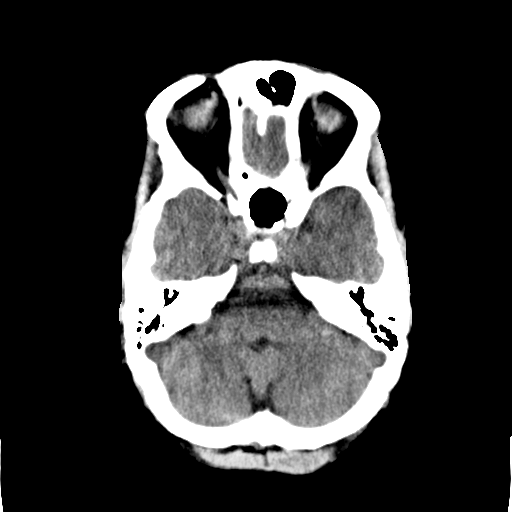
[im 8/34  brain]
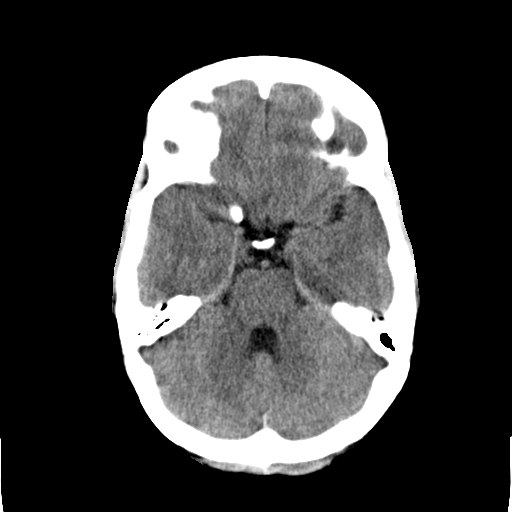
[im 10/34  brain]
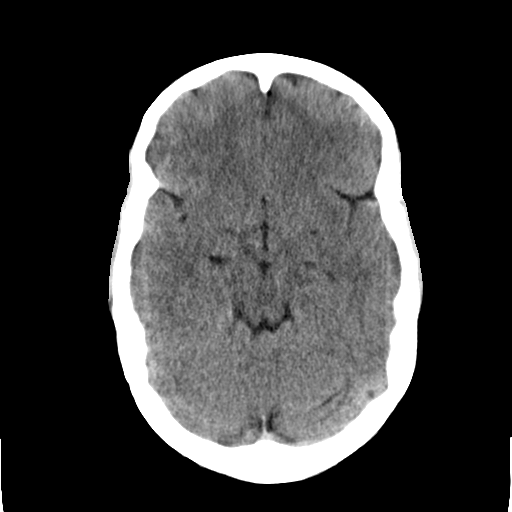
[im 10/34  bone]
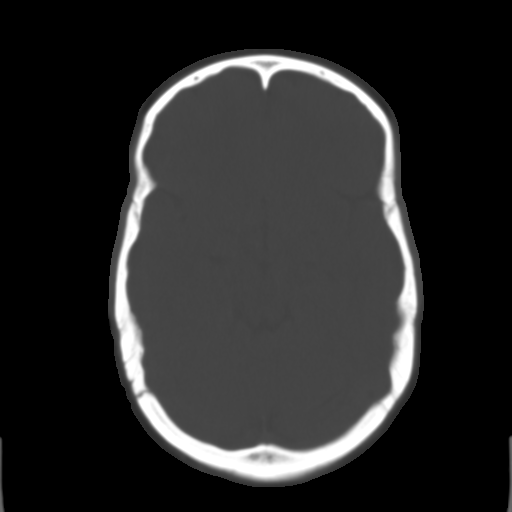
[im 12/34  brain]
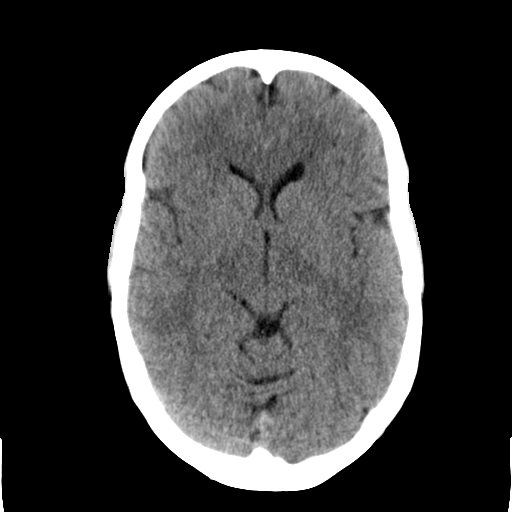
[im 14/34  brain]
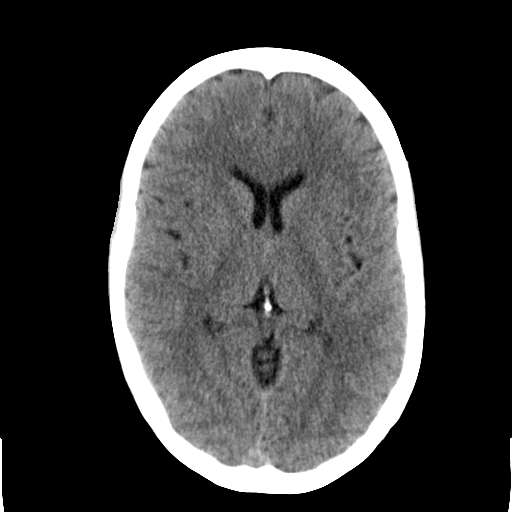
[im 16/34  brain]
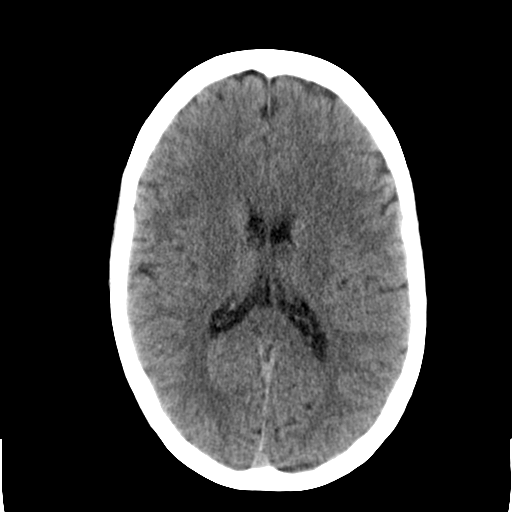
[im 18/34  brain]
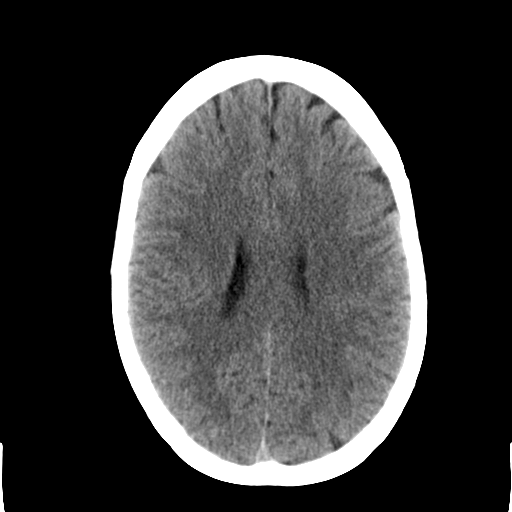
[im 18/34  bone]
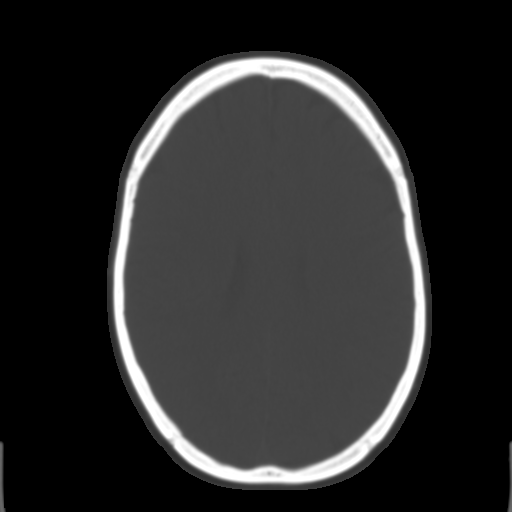
[im 20/34  brain]
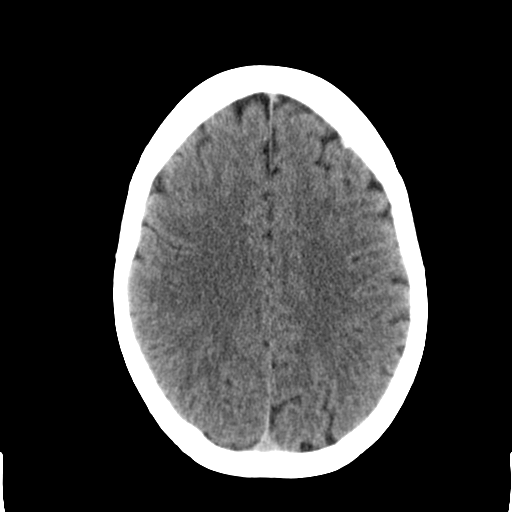
[im 22/34  brain]
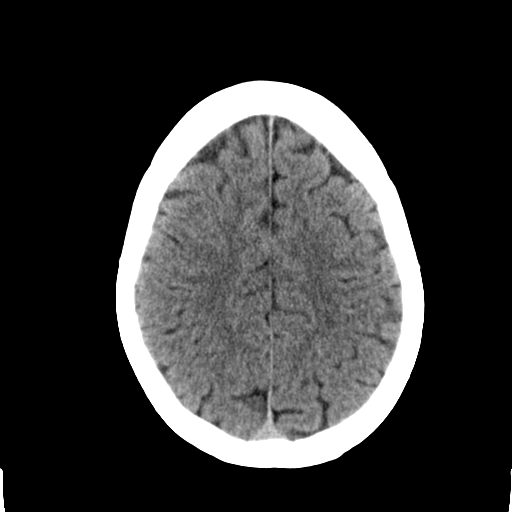
[im 24/34  brain]
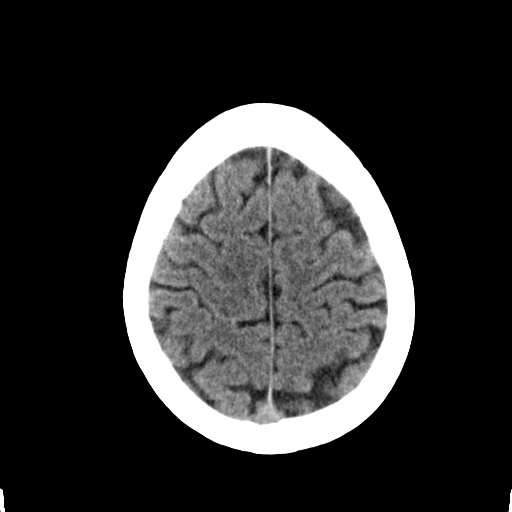
[im 26/34  brain]
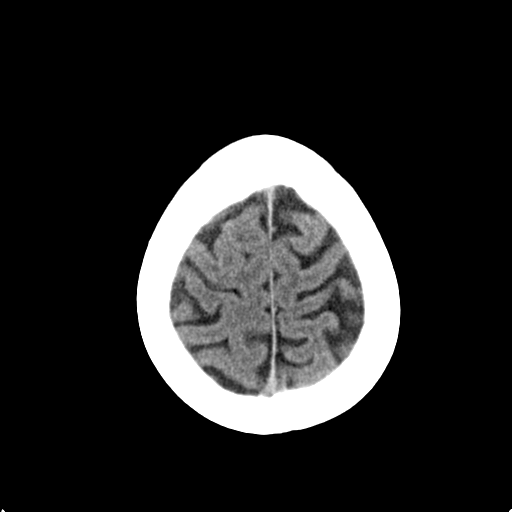
[im 26/34  bone]
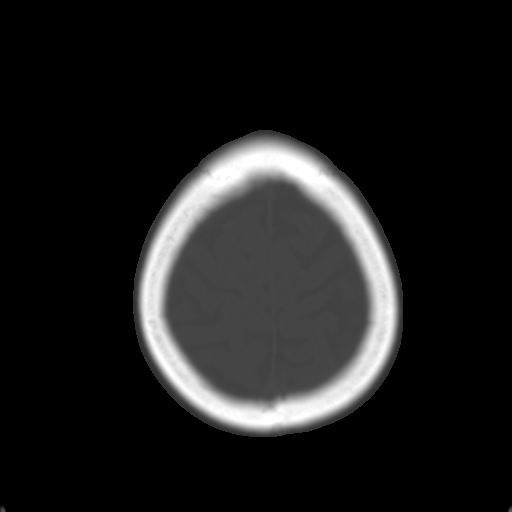
[im 28/34  brain]
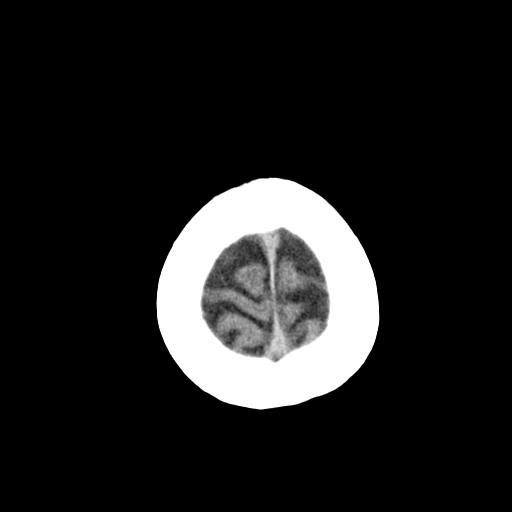
[im 30/34  brain]
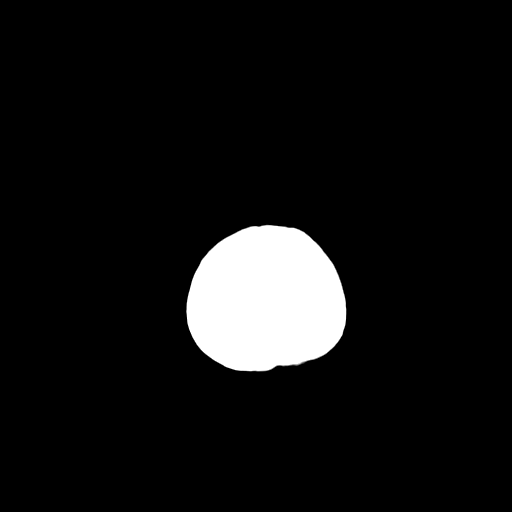
[im 32/34  brain]
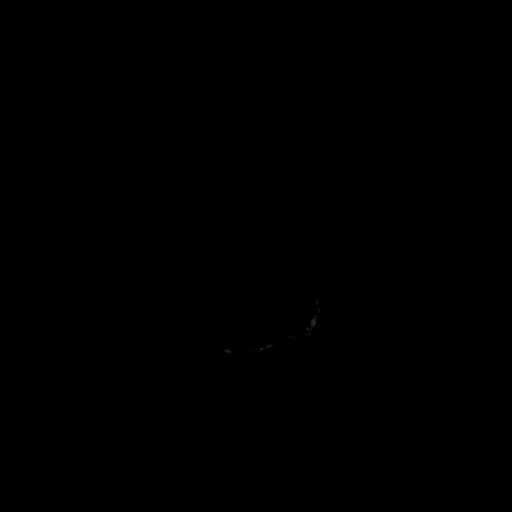

[16 of 30 positions shown; findings below may reference images not displayed]

FINDINGS: No acute intracranial abnormality. Specifically, no hemorrhage,
hydrocephalus, mass lesion, acute infarction, or significant
intracranial injury. No acute calvarial abnormality. Visualized
paranasal sinuses and mastoids clear. Orbital soft tissues
unremarkable.
IMPRESSION: Negative noncontrast head CT.

## 2016-09-26 ENCOUNTER — Other Ambulatory Visit: Payer: Self-pay | Admitting: Nurse Practitioner

## 2016-09-26 DIAGNOSIS — Z1231 Encounter for screening mammogram for malignant neoplasm of breast: Secondary | ICD-10-CM

## 2016-11-20 ENCOUNTER — Ambulatory Visit: Payer: Self-pay

## 2016-12-20 ENCOUNTER — Ambulatory Visit: Payer: Self-pay | Attending: Nurse Practitioner

## 2017-05-13 DIAGNOSIS — K5909 Other constipation: Secondary | ICD-10-CM | POA: Insufficient documentation

## 2017-06-13 DIAGNOSIS — Z79891 Long term (current) use of opiate analgesic: Secondary | ICD-10-CM | POA: Diagnosis not present

## 2017-06-13 DIAGNOSIS — K754 Autoimmune hepatitis: Secondary | ICD-10-CM | POA: Diagnosis not present

## 2017-06-28 DIAGNOSIS — K754 Autoimmune hepatitis: Secondary | ICD-10-CM | POA: Diagnosis not present

## 2017-07-09 DIAGNOSIS — Z79891 Long term (current) use of opiate analgesic: Secondary | ICD-10-CM | POA: Diagnosis not present

## 2017-07-12 DIAGNOSIS — K754 Autoimmune hepatitis: Secondary | ICD-10-CM | POA: Diagnosis not present

## 2017-07-25 DIAGNOSIS — K754 Autoimmune hepatitis: Secondary | ICD-10-CM | POA: Diagnosis not present

## 2017-08-01 DIAGNOSIS — Z79891 Long term (current) use of opiate analgesic: Secondary | ICD-10-CM | POA: Diagnosis not present

## 2017-08-08 DIAGNOSIS — K754 Autoimmune hepatitis: Secondary | ICD-10-CM | POA: Diagnosis not present

## 2017-08-28 DIAGNOSIS — Z79891 Long term (current) use of opiate analgesic: Secondary | ICD-10-CM | POA: Diagnosis not present

## 2017-09-05 DIAGNOSIS — R7989 Other specified abnormal findings of blood chemistry: Secondary | ICD-10-CM | POA: Diagnosis not present

## 2017-09-05 DIAGNOSIS — Z7951 Long term (current) use of inhaled steroids: Secondary | ICD-10-CM | POA: Diagnosis not present

## 2017-09-05 DIAGNOSIS — K754 Autoimmune hepatitis: Secondary | ICD-10-CM | POA: Diagnosis not present

## 2017-09-05 DIAGNOSIS — Z6821 Body mass index (BMI) 21.0-21.9, adult: Secondary | ICD-10-CM | POA: Diagnosis not present

## 2017-09-05 DIAGNOSIS — Z79899 Other long term (current) drug therapy: Secondary | ICD-10-CM | POA: Diagnosis not present

## 2017-09-19 DIAGNOSIS — Z79891 Long term (current) use of opiate analgesic: Secondary | ICD-10-CM | POA: Diagnosis not present

## 2017-10-22 DIAGNOSIS — Z79891 Long term (current) use of opiate analgesic: Secondary | ICD-10-CM | POA: Diagnosis not present

## 2017-11-07 DIAGNOSIS — K754 Autoimmune hepatitis: Secondary | ICD-10-CM | POA: Diagnosis not present

## 2017-11-17 DIAGNOSIS — G43419 Hemiplegic migraine, intractable, without status migrainosus: Secondary | ICD-10-CM | POA: Diagnosis not present

## 2017-11-20 DIAGNOSIS — Z79891 Long term (current) use of opiate analgesic: Secondary | ICD-10-CM | POA: Diagnosis not present

## 2018-01-16 DIAGNOSIS — Z79891 Long term (current) use of opiate analgesic: Secondary | ICD-10-CM | POA: Diagnosis not present

## 2018-02-14 DIAGNOSIS — M545 Low back pain: Secondary | ICD-10-CM | POA: Diagnosis not present

## 2018-02-14 DIAGNOSIS — K5900 Constipation, unspecified: Secondary | ICD-10-CM | POA: Diagnosis not present

## 2018-02-14 DIAGNOSIS — R319 Hematuria, unspecified: Secondary | ICD-10-CM | POA: Diagnosis not present

## 2018-02-14 DIAGNOSIS — K754 Autoimmune hepatitis: Secondary | ICD-10-CM | POA: Diagnosis not present

## 2018-02-17 DIAGNOSIS — R109 Unspecified abdominal pain: Secondary | ICD-10-CM | POA: Diagnosis not present

## 2018-02-17 DIAGNOSIS — K754 Autoimmune hepatitis: Secondary | ICD-10-CM | POA: Diagnosis not present

## 2018-02-17 DIAGNOSIS — E039 Hypothyroidism, unspecified: Secondary | ICD-10-CM | POA: Diagnosis not present

## 2018-02-17 DIAGNOSIS — R319 Hematuria, unspecified: Secondary | ICD-10-CM | POA: Diagnosis not present

## 2018-02-17 DIAGNOSIS — R1010 Upper abdominal pain, unspecified: Secondary | ICD-10-CM | POA: Diagnosis not present

## 2018-02-19 DIAGNOSIS — K754 Autoimmune hepatitis: Secondary | ICD-10-CM | POA: Diagnosis not present

## 2018-02-19 DIAGNOSIS — E039 Hypothyroidism, unspecified: Secondary | ICD-10-CM | POA: Diagnosis not present

## 2018-02-19 DIAGNOSIS — R1010 Upper abdominal pain, unspecified: Secondary | ICD-10-CM | POA: Diagnosis not present

## 2018-02-19 DIAGNOSIS — R319 Hematuria, unspecified: Secondary | ICD-10-CM | POA: Diagnosis not present

## 2018-02-20 DIAGNOSIS — Z79891 Long term (current) use of opiate analgesic: Secondary | ICD-10-CM | POA: Diagnosis not present

## 2018-02-25 DIAGNOSIS — M5127 Other intervertebral disc displacement, lumbosacral region: Secondary | ICD-10-CM | POA: Diagnosis not present

## 2018-02-25 DIAGNOSIS — E039 Hypothyroidism, unspecified: Secondary | ICD-10-CM | POA: Diagnosis not present

## 2018-02-25 DIAGNOSIS — Z882 Allergy status to sulfonamides status: Secondary | ICD-10-CM | POA: Diagnosis not present

## 2018-02-25 DIAGNOSIS — Z7951 Long term (current) use of inhaled steroids: Secondary | ICD-10-CM | POA: Diagnosis not present

## 2018-02-25 DIAGNOSIS — M47816 Spondylosis without myelopathy or radiculopathy, lumbar region: Secondary | ICD-10-CM | POA: Diagnosis not present

## 2018-02-25 DIAGNOSIS — M545 Low back pain: Secondary | ICD-10-CM | POA: Diagnosis not present

## 2018-02-28 DIAGNOSIS — M545 Low back pain: Secondary | ICD-10-CM | POA: Diagnosis not present

## 2018-02-28 DIAGNOSIS — K754 Autoimmune hepatitis: Secondary | ICD-10-CM | POA: Diagnosis not present

## 2018-02-28 DIAGNOSIS — M62830 Muscle spasm of back: Secondary | ICD-10-CM | POA: Diagnosis not present

## 2018-02-28 DIAGNOSIS — M5136 Other intervertebral disc degeneration, lumbar region: Secondary | ICD-10-CM | POA: Diagnosis not present

## 2018-03-06 DIAGNOSIS — M5417 Radiculopathy, lumbosacral region: Secondary | ICD-10-CM | POA: Insufficient documentation

## 2018-03-11 DIAGNOSIS — M5417 Radiculopathy, lumbosacral region: Secondary | ICD-10-CM | POA: Diagnosis not present

## 2018-03-11 DIAGNOSIS — M5117 Intervertebral disc disorders with radiculopathy, lumbosacral region: Secondary | ICD-10-CM | POA: Diagnosis not present

## 2018-03-20 DIAGNOSIS — Z79891 Long term (current) use of opiate analgesic: Secondary | ICD-10-CM | POA: Diagnosis not present

## 2018-04-15 DIAGNOSIS — M5417 Radiculopathy, lumbosacral region: Secondary | ICD-10-CM | POA: Diagnosis not present

## 2018-04-15 DIAGNOSIS — M5117 Intervertebral disc disorders with radiculopathy, lumbosacral region: Secondary | ICD-10-CM | POA: Diagnosis not present

## 2018-06-04 ENCOUNTER — Ambulatory Visit (INDEPENDENT_AMBULATORY_CARE_PROVIDER_SITE_OTHER): Payer: BLUE CROSS/BLUE SHIELD

## 2018-06-04 ENCOUNTER — Encounter: Payer: Self-pay | Admitting: Podiatry

## 2018-06-04 ENCOUNTER — Ambulatory Visit: Payer: BLUE CROSS/BLUE SHIELD | Admitting: Podiatry

## 2018-06-04 VITALS — BP 133/99 | HR 75 | Resp 16

## 2018-06-04 DIAGNOSIS — M779 Enthesopathy, unspecified: Secondary | ICD-10-CM

## 2018-06-04 DIAGNOSIS — M7751 Other enthesopathy of right foot: Secondary | ICD-10-CM

## 2018-06-04 DIAGNOSIS — M778 Other enthesopathies, not elsewhere classified: Secondary | ICD-10-CM

## 2018-06-04 DIAGNOSIS — L603 Nail dystrophy: Secondary | ICD-10-CM

## 2018-06-04 NOTE — Progress Notes (Signed)
Subjective:  Patient ID: Cheryl KatosKimberly Durham, female    DOB: 01/13/1976,  MRN: [id number] HPI Chief Complaint  Patient presents with  . Toe Pain    2nd toe right - pressure feeling under toenail x 1 year, was an active runner until had back issues, tried to treat toenail with filing and OTC fungal meds but no help  . New Patient (Initial Visit)    [age] y.o. female presents with the above complaint.   ROS: Denies fever chills nausea vomiting muscle aches and pains.  Past Medical History:  Diagnosis Date  . Abnormal menstrual cycle   . Anxiety   . 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 Depression   . Endometriosis   . Heavy menstrual bleeding    Past Surgical History:  Procedure Laterality Date  . LAPAROSCOPY ABDOMEN DIAGNOSTIC    . TUBAL LIGATION      Current Outpatient Medications:  .  fluticasone (FLONASE) 50 MCG/ACT nasal spray, Place 2 sprays into both nostrils daily., Disp: 16 g, Rfl: 6 .  azaTHIOprine (IMURAN) 50 MG tablet, , Disp: , Rfl: 1 .  budesonide (ENTOCORT EC) 3 MG 24 hr capsule, , Disp: , Rfl: 1 .  cetirizine (ZYRTEC) 10 MG tablet, Take 10 mg by mouth daily., Disp: , Rfl:  .  levothyroxine (SYNTHROID, LEVOTHROID) 88 MCG tablet, Take 1 tablet (88 mcg total) by mouth daily before breakfast., Disp: 30 tablet, Rfl: 1 .  SUBOXONE 8-2 MG FILM, PLACE 1/2 FILM UNDER TONGUE DAILY, Disp: , Rfl: 0  Allergies  Allergen Reactions  . Sulfa Antibiotics Anaphylaxis, Swelling and Rash   Review of Systems Objective:   Vitals:   06/04/18 0856  BP: (!) 133/99  Pulse: 75  Resp: 16    General: Well developed, nourished, in no acute distress, alert and oriented x3   Dermatological: Skin is warm, dry and supple bilateral. Nails x 10 are well maintained; remaining integument appears unremarkable at this time. There are no open sores, no preulcerative lesions, no rash or signs of infection present.  Slightly thickened nail plate right.  No erythema cellulitis  drainage or odor.  Is mildly tender on palpation.  Vascular: Dorsalis Pedis artery and Posterior Tibial artery pedal pulses are 2/4 bilateral with immedate capillary fill time. Pedal hair growth present. No varicosities and no lower extremity edema present bilateral.   Neruologic: Grossly intact via light touch bilateral. Vibratory intact via tuning fork bilateral. Protective threshold with Semmes Wienstein monofilament intact to all pedal sites bilateral. Patellar and Achilles deep tendon reflexes 2+ bilateral. No Babinski or clonus noted bilateral.   Musculoskeletal: No gross boney pedal deformities bilateral. No pain, crepitus, or limitation noted with foot and ankle range of motion bilateral. Muscular strength 5/5 in all groups tested bilateral.  Mallet toe deformity second right.  Gait: Unassisted, Nonantalgic.    Radiographs:  Radiographs taken today do not demonstrate any type of osseous abnormalities in the area.  Assessment & Plan:   Assessment: Possible painful nail second digit right foot mallet toe deformity right.    Plan: She would like to have the nail avulsed and allow it to grow back.  She tolerated the procedure well after local anesthetic was administered a total of 2 cc 50-50 mixture of Marcaine plain lidocaine plain was infiltrated digital block second right.  Prepped draped as normal sterile fashion the nail was removed.  I will follow-up with her in 2 weeks she was provided both oral and written  home-going instruction for care and soaking of her toe.     Max T. [place], [place]

## 2018-06-04 NOTE — Patient Instructions (Signed)

## 2018-06-05 ENCOUNTER — Ambulatory Visit: Payer: Self-pay | Admitting: Podiatry

## 2018-07-02 ENCOUNTER — Ambulatory Visit: Payer: BLUE CROSS/BLUE SHIELD | Admitting: Podiatry

## 2018-07-03 DIAGNOSIS — Z79891 Long term (current) use of opiate analgesic: Secondary | ICD-10-CM | POA: Diagnosis not present

## 2018-07-06 DIAGNOSIS — B379 Candidiasis, unspecified: Secondary | ICD-10-CM | POA: Diagnosis not present

## 2018-07-06 DIAGNOSIS — T3695XA Adverse effect of unspecified systemic antibiotic, initial encounter: Secondary | ICD-10-CM | POA: Diagnosis not present

## 2018-07-06 DIAGNOSIS — L03031 Cellulitis of right toe: Secondary | ICD-10-CM | POA: Diagnosis not present

## 2018-07-31 DIAGNOSIS — Z79891 Long term (current) use of opiate analgesic: Secondary | ICD-10-CM | POA: Diagnosis not present

## 2018-09-11 DIAGNOSIS — M059 Rheumatoid arthritis with rheumatoid factor, unspecified: Secondary | ICD-10-CM | POA: Diagnosis not present

## 2018-09-11 DIAGNOSIS — E034 Atrophy of thyroid (acquired): Secondary | ICD-10-CM | POA: Diagnosis not present

## 2018-09-11 DIAGNOSIS — F1911 Other psychoactive substance abuse, in remission: Secondary | ICD-10-CM | POA: Diagnosis not present

## 2018-09-11 DIAGNOSIS — K754 Autoimmune hepatitis: Secondary | ICD-10-CM | POA: Diagnosis not present

## 2018-09-11 DIAGNOSIS — Z13220 Encounter for screening for lipoid disorders: Secondary | ICD-10-CM | POA: Diagnosis not present

## 2018-09-11 DIAGNOSIS — Z23 Encounter for immunization: Secondary | ICD-10-CM | POA: Diagnosis not present

## 2018-09-15 ENCOUNTER — Other Ambulatory Visit: Payer: Self-pay | Admitting: Family Medicine

## 2018-09-15 DIAGNOSIS — Z1231 Encounter for screening mammogram for malignant neoplasm of breast: Secondary | ICD-10-CM

## 2018-09-19 DIAGNOSIS — R591 Generalized enlarged lymph nodes: Secondary | ICD-10-CM | POA: Diagnosis not present

## 2018-09-22 DIAGNOSIS — Z79891 Long term (current) use of opiate analgesic: Secondary | ICD-10-CM | POA: Diagnosis not present

## 2018-10-11 DIAGNOSIS — M5441 Lumbago with sciatica, right side: Secondary | ICD-10-CM | POA: Diagnosis not present

## 2018-10-11 DIAGNOSIS — M069 Rheumatoid arthritis, unspecified: Secondary | ICD-10-CM | POA: Diagnosis not present

## 2018-10-11 DIAGNOSIS — Z79899 Other long term (current) drug therapy: Secondary | ICD-10-CM | POA: Diagnosis not present

## 2018-10-11 DIAGNOSIS — D72829 Elevated white blood cell count, unspecified: Secondary | ICD-10-CM | POA: Diagnosis not present

## 2018-10-11 DIAGNOSIS — M5117 Intervertebral disc disorders with radiculopathy, lumbosacral region: Secondary | ICD-10-CM | POA: Diagnosis not present

## 2018-10-11 DIAGNOSIS — M5127 Other intervertebral disc displacement, lumbosacral region: Secondary | ICD-10-CM | POA: Diagnosis not present

## 2018-10-11 DIAGNOSIS — Z5181 Encounter for therapeutic drug level monitoring: Secondary | ICD-10-CM | POA: Diagnosis not present

## 2018-10-12 DIAGNOSIS — M5127 Other intervertebral disc displacement, lumbosacral region: Secondary | ICD-10-CM | POA: Diagnosis not present

## 2018-10-16 DIAGNOSIS — M5417 Radiculopathy, lumbosacral region: Secondary | ICD-10-CM | POA: Diagnosis not present

## 2018-10-21 DIAGNOSIS — M5417 Radiculopathy, lumbosacral region: Secondary | ICD-10-CM | POA: Diagnosis not present

## 2018-10-21 DIAGNOSIS — M5116 Intervertebral disc disorders with radiculopathy, lumbar region: Secondary | ICD-10-CM | POA: Diagnosis not present

## 2018-10-21 DIAGNOSIS — R531 Weakness: Secondary | ICD-10-CM | POA: Diagnosis not present

## 2018-10-21 DIAGNOSIS — Z01818 Encounter for other preprocedural examination: Secondary | ICD-10-CM | POA: Diagnosis not present

## 2018-10-21 DIAGNOSIS — Z8249 Family history of ischemic heart disease and other diseases of the circulatory system: Secondary | ICD-10-CM | POA: Diagnosis not present

## 2018-10-21 DIAGNOSIS — R200 Anesthesia of skin: Secondary | ICD-10-CM | POA: Diagnosis not present

## 2018-10-21 DIAGNOSIS — Z808 Family history of malignant neoplasm of other organs or systems: Secondary | ICD-10-CM | POA: Diagnosis not present

## 2018-10-21 DIAGNOSIS — Z803 Family history of malignant neoplasm of breast: Secondary | ICD-10-CM | POA: Diagnosis not present

## 2018-10-21 DIAGNOSIS — Z833 Family history of diabetes mellitus: Secondary | ICD-10-CM | POA: Diagnosis not present

## 2018-10-23 DIAGNOSIS — Z79899 Other long term (current) drug therapy: Secondary | ICD-10-CM | POA: Diagnosis not present

## 2018-10-23 DIAGNOSIS — M5116 Intervertebral disc disorders with radiculopathy, lumbar region: Secondary | ICD-10-CM | POA: Diagnosis not present

## 2018-10-23 DIAGNOSIS — K754 Autoimmune hepatitis: Secondary | ICD-10-CM | POA: Diagnosis not present

## 2018-10-23 DIAGNOSIS — K219 Gastro-esophageal reflux disease without esophagitis: Secondary | ICD-10-CM | POA: Diagnosis not present

## 2018-10-23 DIAGNOSIS — M5416 Radiculopathy, lumbar region: Secondary | ICD-10-CM | POA: Diagnosis not present

## 2018-10-23 DIAGNOSIS — E039 Hypothyroidism, unspecified: Secondary | ICD-10-CM | POA: Diagnosis not present

## 2018-10-23 DIAGNOSIS — M069 Rheumatoid arthritis, unspecified: Secondary | ICD-10-CM | POA: Diagnosis not present

## 2018-10-23 DIAGNOSIS — M5126 Other intervertebral disc displacement, lumbar region: Secondary | ICD-10-CM | POA: Diagnosis not present

## 2018-10-23 DIAGNOSIS — G8929 Other chronic pain: Secondary | ICD-10-CM | POA: Diagnosis not present

## 2018-10-23 DIAGNOSIS — M5117 Intervertebral disc disorders with radiculopathy, lumbosacral region: Secondary | ICD-10-CM | POA: Diagnosis not present

## 2018-10-23 DIAGNOSIS — Z7951 Long term (current) use of inhaled steroids: Secondary | ICD-10-CM | POA: Diagnosis not present

## 2018-10-23 DIAGNOSIS — I10 Essential (primary) hypertension: Secondary | ICD-10-CM | POA: Diagnosis not present

## 2018-10-24 DIAGNOSIS — M069 Rheumatoid arthritis, unspecified: Secondary | ICD-10-CM | POA: Diagnosis not present

## 2018-10-24 DIAGNOSIS — K219 Gastro-esophageal reflux disease without esophagitis: Secondary | ICD-10-CM | POA: Diagnosis not present

## 2018-10-24 DIAGNOSIS — G8929 Other chronic pain: Secondary | ICD-10-CM | POA: Diagnosis not present

## 2018-10-24 DIAGNOSIS — M5126 Other intervertebral disc displacement, lumbar region: Secondary | ICD-10-CM | POA: Diagnosis not present

## 2018-10-24 DIAGNOSIS — M5416 Radiculopathy, lumbar region: Secondary | ICD-10-CM | POA: Diagnosis not present

## 2018-10-24 DIAGNOSIS — K754 Autoimmune hepatitis: Secondary | ICD-10-CM | POA: Diagnosis not present

## 2018-10-24 DIAGNOSIS — M5417 Radiculopathy, lumbosacral region: Secondary | ICD-10-CM | POA: Diagnosis not present

## 2018-10-24 DIAGNOSIS — Z79899 Other long term (current) drug therapy: Secondary | ICD-10-CM | POA: Diagnosis not present

## 2018-10-24 DIAGNOSIS — M5116 Intervertebral disc disorders with radiculopathy, lumbar region: Secondary | ICD-10-CM | POA: Diagnosis not present

## 2018-10-24 DIAGNOSIS — E039 Hypothyroidism, unspecified: Secondary | ICD-10-CM | POA: Diagnosis not present

## 2018-10-24 DIAGNOSIS — I10 Essential (primary) hypertension: Secondary | ICD-10-CM | POA: Diagnosis not present

## 2018-10-24 DIAGNOSIS — Z7951 Long term (current) use of inhaled steroids: Secondary | ICD-10-CM | POA: Diagnosis not present

## 2018-11-10 DIAGNOSIS — Z9889 Other specified postprocedural states: Secondary | ICD-10-CM | POA: Diagnosis not present

## 2018-12-02 DIAGNOSIS — Z9889 Other specified postprocedural states: Secondary | ICD-10-CM | POA: Diagnosis not present

## 2018-12-11 DIAGNOSIS — M545 Low back pain: Secondary | ICD-10-CM | POA: Diagnosis not present

## 2018-12-11 DIAGNOSIS — Z4789 Encounter for other orthopedic aftercare: Secondary | ICD-10-CM | POA: Diagnosis not present

## 2018-12-24 DIAGNOSIS — Z79891 Long term (current) use of opiate analgesic: Secondary | ICD-10-CM | POA: Diagnosis not present

## 2018-12-26 DIAGNOSIS — Z4789 Encounter for other orthopedic aftercare: Secondary | ICD-10-CM | POA: Diagnosis not present

## 2018-12-26 DIAGNOSIS — M545 Low back pain: Secondary | ICD-10-CM | POA: Diagnosis not present

## 2019-02-05 DIAGNOSIS — Z4789 Encounter for other orthopedic aftercare: Secondary | ICD-10-CM | POA: Diagnosis not present

## 2019-02-05 DIAGNOSIS — M545 Low back pain: Secondary | ICD-10-CM | POA: Diagnosis not present

## 2019-02-23 DIAGNOSIS — Z79891 Long term (current) use of opiate analgesic: Secondary | ICD-10-CM | POA: Diagnosis not present

## 2019-06-23 DIAGNOSIS — L02415 Cutaneous abscess of right lower limb: Secondary | ICD-10-CM | POA: Diagnosis not present

## 2019-07-09 DIAGNOSIS — Z79891 Long term (current) use of opiate analgesic: Secondary | ICD-10-CM | POA: Diagnosis not present

## 2019-09-05 DIAGNOSIS — N3001 Acute cystitis with hematuria: Secondary | ICD-10-CM | POA: Diagnosis not present

## 2019-09-05 DIAGNOSIS — R309 Painful micturition, unspecified: Secondary | ICD-10-CM | POA: Diagnosis not present

## 2019-10-22 DIAGNOSIS — Z79891 Long term (current) use of opiate analgesic: Secondary | ICD-10-CM | POA: Diagnosis not present

## 2019-10-25 DIAGNOSIS — L03011 Cellulitis of right finger: Secondary | ICD-10-CM | POA: Diagnosis not present

## 2019-11-06 DIAGNOSIS — Z131 Encounter for screening for diabetes mellitus: Secondary | ICD-10-CM | POA: Diagnosis not present

## 2019-11-06 DIAGNOSIS — Z1231 Encounter for screening mammogram for malignant neoplasm of breast: Secondary | ICD-10-CM | POA: Diagnosis not present

## 2019-11-06 DIAGNOSIS — Z1159 Encounter for screening for other viral diseases: Secondary | ICD-10-CM | POA: Diagnosis not present

## 2019-11-06 DIAGNOSIS — Z13220 Encounter for screening for lipoid disorders: Secondary | ICD-10-CM | POA: Diagnosis not present

## 2019-11-06 DIAGNOSIS — Z114 Encounter for screening for human immunodeficiency virus [HIV]: Secondary | ICD-10-CM | POA: Diagnosis not present

## 2019-11-06 DIAGNOSIS — E034 Atrophy of thyroid (acquired): Secondary | ICD-10-CM | POA: Diagnosis not present

## 2019-11-06 DIAGNOSIS — Z23 Encounter for immunization: Secondary | ICD-10-CM | POA: Diagnosis not present

## 2019-11-06 DIAGNOSIS — Z0000 Encounter for general adult medical examination without abnormal findings: Secondary | ICD-10-CM | POA: Diagnosis not present

## 2019-11-06 DIAGNOSIS — K754 Autoimmune hepatitis: Secondary | ICD-10-CM | POA: Diagnosis not present

## 2019-11-22 DIAGNOSIS — Z03818 Encounter for observation for suspected exposure to other biological agents ruled out: Secondary | ICD-10-CM | POA: Diagnosis not present

## 2019-11-22 DIAGNOSIS — Z20828 Contact with and (suspected) exposure to other viral communicable diseases: Secondary | ICD-10-CM | POA: Diagnosis not present

## 2019-12-05 DIAGNOSIS — M545 Low back pain: Secondary | ICD-10-CM | POA: Diagnosis not present

## 2019-12-09 DIAGNOSIS — M5441 Lumbago with sciatica, right side: Secondary | ICD-10-CM | POA: Diagnosis not present

## 2019-12-11 DIAGNOSIS — M5116 Intervertebral disc disorders with radiculopathy, lumbar region: Secondary | ICD-10-CM | POA: Diagnosis not present

## 2019-12-11 DIAGNOSIS — M5441 Lumbago with sciatica, right side: Secondary | ICD-10-CM | POA: Diagnosis not present

## 2019-12-11 DIAGNOSIS — M5117 Intervertebral disc disorders with radiculopathy, lumbosacral region: Secondary | ICD-10-CM | POA: Diagnosis not present

## 2019-12-11 DIAGNOSIS — G1220 Motor neuron disease, unspecified: Secondary | ICD-10-CM | POA: Diagnosis not present

## 2019-12-15 DIAGNOSIS — M5116 Intervertebral disc disorders with radiculopathy, lumbar region: Secondary | ICD-10-CM | POA: Diagnosis not present

## 2019-12-17 DIAGNOSIS — M5117 Intervertebral disc disorders with radiculopathy, lumbosacral region: Secondary | ICD-10-CM | POA: Diagnosis not present

## 2019-12-17 DIAGNOSIS — M5417 Radiculopathy, lumbosacral region: Secondary | ICD-10-CM | POA: Diagnosis not present

## 2019-12-30 DIAGNOSIS — L02411 Cutaneous abscess of right axilla: Secondary | ICD-10-CM | POA: Diagnosis not present

## 2020-02-12 DIAGNOSIS — L732 Hidradenitis suppurativa: Secondary | ICD-10-CM | POA: Diagnosis not present

## 2020-02-12 DIAGNOSIS — D849 Immunodeficiency, unspecified: Secondary | ICD-10-CM | POA: Diagnosis not present

## 2020-02-12 DIAGNOSIS — K754 Autoimmune hepatitis: Secondary | ICD-10-CM | POA: Diagnosis not present

## 2020-02-26 DIAGNOSIS — K754 Autoimmune hepatitis: Secondary | ICD-10-CM | POA: Diagnosis not present

## 2020-04-07 DIAGNOSIS — F329 Major depressive disorder, single episode, unspecified: Secondary | ICD-10-CM | POA: Diagnosis not present

## 2024-12-25 ENCOUNTER — Ambulatory Visit: Admitting: Internal Medicine
